# Patient Record
Sex: Female | Born: 1960 | Race: White | Hispanic: No | Marital: Single | Smoking: Never smoker
Health system: Southern US, Community
[De-identification: ages and names within clinical notes are randomized; demographics above are authoritative.]

## PROBLEM LIST (undated history)

## (undated) DIAGNOSIS — I10 Essential (primary) hypertension: Secondary | ICD-10-CM

## (undated) DIAGNOSIS — E119 Type 2 diabetes mellitus without complications: Secondary | ICD-10-CM

## (undated) DIAGNOSIS — G629 Polyneuropathy, unspecified: Secondary | ICD-10-CM

## (undated) DIAGNOSIS — M5430 Sciatica, unspecified side: Secondary | ICD-10-CM

## (undated) HISTORY — PX: BACK SURGERY: SHX140

---

## 2018-11-27 ENCOUNTER — Emergency Department (HOSPITAL_BASED_OUTPATIENT_CLINIC_OR_DEPARTMENT_OTHER): Payer: Commercial Managed Care - PPO

## 2018-11-27 ENCOUNTER — Observation Stay (HOSPITAL_COMMUNITY): Payer: Commercial Managed Care - PPO

## 2018-11-27 ENCOUNTER — Encounter (HOSPITAL_BASED_OUTPATIENT_CLINIC_OR_DEPARTMENT_OTHER): Payer: Self-pay | Admitting: Adult Health

## 2018-11-27 ENCOUNTER — Other Ambulatory Visit: Payer: Self-pay

## 2018-11-27 ENCOUNTER — Inpatient Hospital Stay (HOSPITAL_BASED_OUTPATIENT_CLINIC_OR_DEPARTMENT_OTHER)
Admission: EM | Admit: 2018-11-27 | Discharge: 2018-11-29 | DRG: 065 | Disposition: A | Discharge status: Home / Self Care | Payer: Commercial Managed Care - PPO | Attending: Family Medicine | Admitting: Family Medicine

## 2018-11-27 DIAGNOSIS — Z6838 Body mass index (BMI) 38.0-38.9, adult: Secondary | ICD-10-CM | POA: Diagnosis not present

## 2018-11-27 DIAGNOSIS — R29810 Facial weakness: Secondary | ICD-10-CM | POA: Diagnosis present

## 2018-11-27 DIAGNOSIS — E1151 Type 2 diabetes mellitus with diabetic peripheral angiopathy without gangrene: Secondary | ICD-10-CM | POA: Diagnosis present

## 2018-11-27 DIAGNOSIS — Z20828 Contact with and (suspected) exposure to other viral communicable diseases: Secondary | ICD-10-CM | POA: Diagnosis present

## 2018-11-27 DIAGNOSIS — E1169 Type 2 diabetes mellitus with other specified complication: Secondary | ICD-10-CM | POA: Diagnosis present

## 2018-11-27 DIAGNOSIS — I152 Hypertension secondary to endocrine disorders: Secondary | ICD-10-CM | POA: Diagnosis not present

## 2018-11-27 DIAGNOSIS — E1165 Type 2 diabetes mellitus with hyperglycemia: Secondary | ICD-10-CM | POA: Diagnosis not present

## 2018-11-27 DIAGNOSIS — Z79899 Other long term (current) drug therapy: Secondary | ICD-10-CM | POA: Diagnosis not present

## 2018-11-27 DIAGNOSIS — I6381 Other cerebral infarction due to occlusion or stenosis of small artery: Principal | ICD-10-CM | POA: Diagnosis present

## 2018-11-27 DIAGNOSIS — R29704 NIHSS score 4: Secondary | ICD-10-CM | POA: Diagnosis not present

## 2018-11-27 DIAGNOSIS — Z833 Family history of diabetes mellitus: Secondary | ICD-10-CM

## 2018-11-27 DIAGNOSIS — E785 Hyperlipidemia, unspecified: Secondary | ICD-10-CM | POA: Diagnosis present

## 2018-11-27 DIAGNOSIS — Z8249 Family history of ischemic heart disease and other diseases of the circulatory system: Secondary | ICD-10-CM | POA: Diagnosis not present

## 2018-11-27 DIAGNOSIS — I6522 Occlusion and stenosis of left carotid artery: Secondary | ICD-10-CM | POA: Diagnosis not present

## 2018-11-27 DIAGNOSIS — E1159 Type 2 diabetes mellitus with other circulatory complications: Secondary | ICD-10-CM | POA: Diagnosis present

## 2018-11-27 DIAGNOSIS — E669 Obesity, unspecified: Secondary | ICD-10-CM | POA: Diagnosis not present

## 2018-11-27 DIAGNOSIS — I672 Cerebral atherosclerosis: Secondary | ICD-10-CM | POA: Diagnosis present

## 2018-11-27 DIAGNOSIS — E119 Type 2 diabetes mellitus without complications: Secondary | ICD-10-CM

## 2018-11-27 DIAGNOSIS — I6322 Cerebral infarction due to unspecified occlusion or stenosis of basilar arteries: Secondary | ICD-10-CM | POA: Diagnosis present

## 2018-11-27 DIAGNOSIS — I63211 Cerebral infarction due to unspecified occlusion or stenosis of right vertebral arteries: Secondary | ICD-10-CM | POA: Diagnosis present

## 2018-11-27 DIAGNOSIS — I69354 Hemiplegia and hemiparesis following cerebral infarction affecting left non-dominant side: Secondary | ICD-10-CM

## 2018-11-27 DIAGNOSIS — Z794 Long term (current) use of insulin: Secondary | ICD-10-CM

## 2018-11-27 DIAGNOSIS — R531 Weakness: Secondary | ICD-10-CM

## 2018-11-27 DIAGNOSIS — I639 Cerebral infarction, unspecified: Secondary | ICD-10-CM | POA: Diagnosis present

## 2018-11-27 DIAGNOSIS — I10 Essential (primary) hypertension: Secondary | ICD-10-CM

## 2018-11-27 HISTORY — DX: Type 2 diabetes mellitus without complications: E11.9

## 2018-11-27 HISTORY — DX: Essential (primary) hypertension: I10

## 2018-11-27 LAB — CBC
HCT: 41.8 % (ref 36.0–46.0)
Hemoglobin: 13.3 g/dL (ref 12.0–15.0)
MCH: 27.1 pg (ref 26.0–34.0)
MCHC: 31.8 g/dL (ref 30.0–36.0)
MCV: 85.3 fL (ref 80.0–100.0)
Platelets: 231 10*3/uL (ref 150–400)
RBC: 4.9 MIL/uL (ref 3.87–5.11)
RDW: 14.2 % (ref 11.5–15.5)
WBC: 10 10*3/uL (ref 4.0–10.5)
nRBC: 0 % (ref 0.0–0.2)

## 2018-11-27 LAB — COMPREHENSIVE METABOLIC PANEL
ALT: 18 U/L (ref 0–44)
AST: 19 U/L (ref 15–41)
Albumin: 3.3 g/dL — ABNORMAL LOW (ref 3.5–5.0)
Alkaline Phosphatase: 78 U/L (ref 38–126)
Anion gap: 8 (ref 5–15)
BUN: 21 mg/dL — ABNORMAL HIGH (ref 6–20)
CO2: 22 mmol/L (ref 22–32)
Calcium: 8.7 mg/dL — ABNORMAL LOW (ref 8.9–10.3)
Chloride: 107 mmol/L (ref 98–111)
Creatinine, Ser: 1.1 mg/dL — ABNORMAL HIGH (ref 0.44–1.00)
GFR calc Af Amer: >60 mL/min (ref >60)
GFR calc non Af Amer: 56 mL/min — ABNORMAL LOW (ref >60)
Glucose, Bld: 181 mg/dL — ABNORMAL HIGH (ref 70–99)
Potassium: 3.8 mmol/L (ref 3.5–5.1)
Sodium: 137 mmol/L (ref 135–145)
Total Bilirubin: 0.3 mg/dL (ref 0.3–1.2)
Total Protein: 6.4 g/dL — ABNORMAL LOW (ref 6.5–8.1)

## 2018-11-27 LAB — DIFFERENTIAL
Abs Immature Granulocytes: 0.03 10*3/uL (ref 0.00–0.07)
Basophils Absolute: 0.1 10*3/uL (ref 0.0–0.1)
Basophils Relative: 1 %
Eosinophils Absolute: 0.3 10*3/uL (ref 0.0–0.5)
Eosinophils Relative: 3 %
Immature Granulocytes: 0 %
Lymphocytes Relative: 21 %
Lymphs Abs: 2.1 10*3/uL (ref 0.7–4.0)
Monocytes Absolute: 0.9 10*3/uL (ref 0.1–1.0)
Monocytes Relative: 9 %
Neutro Abs: 6.6 10*3/uL (ref 1.7–7.7)
Neutrophils Relative %: 66 %

## 2018-11-27 LAB — RAPID URINE DRUG SCREEN, HOSP PERFORMED
Amphetamines: NOT DETECTED
Barbiturates: NOT DETECTED
Benzodiazepines: NOT DETECTED
Cocaine: NOT DETECTED
Opiates: NOT DETECTED
Tetrahydrocannabinol: NOT DETECTED

## 2018-11-27 LAB — URINALYSIS, ROUTINE W REFLEX MICROSCOPIC
Bilirubin Urine: NEGATIVE
Glucose, UA: 250 mg/dL — AB
Ketones, ur: 15 mg/dL — AB
Nitrite: NEGATIVE
Protein, ur: 100 mg/dL — AB
Specific Gravity, Urine: >1.03 — ABNORMAL HIGH (ref 1.005–1.030)
pH: 5.5 (ref 5.0–8.0)

## 2018-11-27 LAB — ETHANOL: Alcohol, Ethyl (B): <10 mg/dL (ref <10)

## 2018-11-27 LAB — URINALYSIS, MICROSCOPIC (REFLEX)

## 2018-11-27 LAB — CBG MONITORING, ED: Glucose-Capillary: 215 mg/dL — ABNORMAL HIGH (ref 70–99)

## 2018-11-27 LAB — APTT: aPTT: 26 seconds (ref 24–36)

## 2018-11-27 LAB — PREGNANCY, URINE: Preg Test, Ur: NEGATIVE

## 2018-11-27 LAB — PROTIME-INR
INR: 1 (ref 0.8–1.2)
Prothrombin Time: 13.1 seconds (ref 11.4–15.2)

## 2018-11-27 LAB — GLUCOSE, CAPILLARY: Glucose-Capillary: 198 mg/dL — ABNORMAL HIGH (ref 70–99)

## 2018-11-27 LAB — SARS CORONAVIRUS 2 BY RT PCR (HOSPITAL ORDER, PERFORMED IN ~~LOC~~ HOSPITAL LAB): SARS Coronavirus 2: NEGATIVE

## 2018-11-27 MED ORDER — ACETAMINOPHEN 325 MG PO TABS
650.0000 mg | ORAL_TABLET | ORAL | Status: DC | PRN
  Administered 2018-11-27: 650 mg via ORAL
  Filled 2018-11-27: qty 2

## 2018-11-27 MED ORDER — ASPIRIN 300 MG RE SUPP: 300.0000 mg | Freq: Every day | RECTAL | Status: DC

## 2018-11-27 MED ORDER — ACETAMINOPHEN 650 MG RE SUPP: 650.0000 mg | RECTAL | Status: DC | PRN

## 2018-11-27 MED ORDER — ATORVASTATIN CALCIUM 40 MG PO TABS
40.0000 mg | ORAL_TABLET | Freq: Every day | ORAL | Status: DC
  Filled 2018-11-27: qty 1

## 2018-11-27 MED ORDER — SODIUM CHLORIDE 0.9 % IV BOLUS
1000.0000 mL | Freq: Once | INTRAVENOUS | Status: AC
  Administered 2018-11-27: 1000 mL via INTRAVENOUS

## 2018-11-27 MED ORDER — IOHEXOL 350 MG/ML SOLN
75.0000 mL | Freq: Once | INTRAVENOUS | Status: AC | PRN
  Administered 2018-11-27: 75 mL via INTRAVENOUS

## 2018-11-27 MED ORDER — STROKE: EARLY STAGES OF RECOVERY BOOK
Freq: Once | Status: AC
  Administered 2018-11-27
  Filled 2018-11-27: qty 1

## 2018-11-27 MED ORDER — ENOXAPARIN SODIUM 40 MG/0.4ML ~~LOC~~ SOLN
40.0000 mg | SUBCUTANEOUS | Status: DC
  Administered 2018-11-27: 40 mg via SUBCUTANEOUS
  Filled 2018-11-27 (×3): qty 0.4

## 2018-11-27 MED ORDER — SENNOSIDES-DOCUSATE SODIUM 8.6-50 MG PO TABS: 1.0000 | ORAL_TABLET | Freq: Every evening | ORAL | Status: DC | PRN

## 2018-11-27 MED ORDER — INSULIN ASPART 100 UNIT/ML ~~LOC~~ SOLN
0.0000 [IU] | Freq: Three times a day (TID) | SUBCUTANEOUS | Status: DC
  Administered 2018-11-28: 3 [IU] via SUBCUTANEOUS
  Administered 2018-11-28: 9 [IU] via SUBCUTANEOUS
  Administered 2018-11-28 – 2018-11-29 (×3): 5 [IU] via SUBCUTANEOUS

## 2018-11-27 MED ORDER — ASPIRIN 325 MG PO TABS
325.0000 mg | ORAL_TABLET | Freq: Every day | ORAL | Status: DC
  Administered 2018-11-27: 325 mg via ORAL
  Filled 2018-11-27: qty 1

## 2018-11-27 MED ORDER — ACETAMINOPHEN 160 MG/5ML PO SOLN: 650.0000 mg | ORAL | Status: DC | PRN

## 2018-11-27 NOTE — ED Notes (Signed)
Per charge pt placed in hallway 19 bed

## 2018-11-27 NOTE — ED Notes (Signed)
Report called to Will, RN.

## 2018-11-27 NOTE — H&P (Signed)
History and Physical    Tiffany Schmitt ZOX:[id number] DOB: Oct 09, 1960 DOA: 11/27/2018  PCP: Patient, No Pcp Per  Patient coming from: [facility] ED  I have personally briefly reviewed patient's old medical records in [facility]  Chief Complaint: Left-sided weakness  HPI: Tiffany Schmitt is a 58 y.o. female with medical history significant for type 2 diabetes and hypertension who presented to [facility] ED for evaluation of left-sided weakness.  Patient states she was in her usual state of health until 11/23/2018.  She reports lifting heavy objects earlier in the day and then later in the evening she developed new onset left-sided weakness affecting her arm and leg.  She reports experiencing left-sided headache afterwards with some nausea, lightheadedness, dizziness without syncope.  She reports occasional palpitations without chest pain or dyspnea.  She denies any dysuria.  She has been having difficulty with ambulation.  She reports chronic decreased sensation in her feet attributed to her diabetes.  ED Course:  Initial vitals showed BP 152/79, pulse 74, RR 18, temp 98.9 Fahrenheit, SPO2 99% on room air.  Labs notable for WBC 10.0, hemoglobin 13.3, platelets 231,000, sodium 137, potassium 3.8, bicarb 22, BUN 21, creatinine 1.1, serum glucose 181.  UDS and serum ethanol levels were undetectable.  Urinalysis showed negative nitrites, small leukocytes, 0-5 RBCs, 6-10 WBCs, many bacteria on microscopy.  Urine pregnancy test was negative.  SARS-CoV-2 test was negative.  CT head without contrast was negative for acute infarction or intracranial hemorrhage.  CTA head/neck showed intracranial atherosclerotic disease with multifocal stenoses with moderate stenosis within the paraclinoid right ICA, moderate stenosis within cavernous left ICA, moderate stenosis within the intracranial right vertebral artery, focal moderate stenosis within the mid to distal basilar artery, and focal  moderate/severe stenosis within the P2 left posterior cerebral artery.  The common and internal carotid arteries are patent within the neck bilaterally, radiology report suspects greater than 50% stenosis at the origin of the left ICA.  Bilateral vertebral arteries were patent within the neck.  Neurology were consulted and recommended ED to ED transfer for stat MRI brain and medical admission.  Review of Systems: All systems reviewed and are negative except as documented in history of present illness above.   Past Medical History:  Diagnosis Date   Diabetes mellitus without complication (HCC)    Hypertension     History reviewed. No pertinent surgical history.  Social History:  reports that she has never smoked. She has never used smokeless tobacco. She reports that she does not drink alcohol or use drugs.  No Known Allergies  Family History  Problem Relation Age of Onset   Hypertension Mother    Diabetes Mother    Hypertension Father    Diabetes Father      Prior to Admission medications   Medication Sig Start Date End Date Taking? Authorizing Provider  insulin lispro (HUMALOG) 100 UNIT/ML injection Use 76 units with V-Go insulin patch daily, up to 100 units 03/20/18  Yes [provider]  lisinopril-hydrochlorothiazide (ZESTORETIC) 10-12.5 MG tablet TAKE 1 TABLET BY MOUTH ONCE DAILY 04/25/16  Yes [provider]  metFORMIN (GLUCOPHAGE) 1000 MG tablet Take 1,000 mg by mouth 2 (two) times daily. 10/28/18   [provider]  TRULICITY 0.75 MG/0.5ML SOPN INJECT 0.75MG  INTO THE SKIN ONCE A WEEK 11/09/18   [provider]    Physical Exam: Vitals:   11/27/18 1730 11/27/18 1825 11/27/18 1936 11/27/18 1938  BP: (!) 151/68 (!) 145/65  (!) 176/76  Pulse: 66 77  71  Resp: 19 18  16   Temp:    98.4 F (36.9 C)  TempSrc:    Oral  SpO2: 96% 98% 98% 96%  Weight:      Height:        Constitutional: Obese woman resting in the left lateral  decubitus position, NAD, calm, comfortable Eyes: PERRL, lids and conjunctivae normal ENMT: Mucous membranes are moist. Posterior pharynx clear of any exudate or lesions.Normal dentition.  Neck: normal, supple, no masses. Respiratory: clear to auscultation bilaterally, no wheezing, no crackles. Normal respiratory effort. No accessory muscle use.  Cardiovascular: Regular rate and rhythm, no murmurs / rubs / gallops. No extremity edema. Abdomen: no tenderness, no masses palpated. No hepatosplenomegaly. Bowel sounds positive.  Musculoskeletal: no clubbing / cyanosis. No joint deformity upper and lower extremities. Good ROM, no contractures. Normal muscle tone.  Skin: no rashes, lesions, ulcers. No induration Neurologic: CN 2-12 grossly intact except for slight right facial drooping. Sensation intact, DTR normal. Strength 5/5 in all extremities except 4/5 LUE.  Psychiatric: Normal judgment and insight. Alert and oriented x 3. Normal mood.     Labs on Admission: I have personally reviewed following labs and imaging studies  CBC: Recent Labs  Lab 11/27/18 1323  WBC 10.0  NEUTROABS 6.6  HGB 13.3  HCT 41.8  MCV 85.3  PLT 231   Basic Metabolic Panel: Recent Labs  Lab 11/27/18 1353  NA 137  K 3.8  CL 107  CO2 22  GLUCOSE 181*  BUN 21*  CREATININE 1.10*  CALCIUM 8.7*   GFR: Estimated Creatinine Clearance: 63.6 mL/min (A) (by C-G formula based on SCr of 1.1 mg/dL (H)). Liver Function Tests: Recent Labs  Lab 11/27/18 1353  AST 19  ALT 18  ALKPHOS 78  BILITOT 0.3  PROT 6.4*  ALBUMIN 3.3*   No results for input(s): LIPASE, AMYLASE in the last 168 hours. No results for input(s): AMMONIA in the last 168 hours. Coagulation Profile: Recent Labs  Lab 11/27/18 1353  INR 1.0   Cardiac Enzymes: No results for input(s): CKTOTAL, CKMB, CKMBINDEX, TROPONINI in the last 168 hours. BNP (last 3 results) No results for input(s): PROBNP in the last 8760 hours. HbA1C: No results for  input(s): HGBA1C in the last 72 hours. CBG: Recent Labs  Lab 11/27/18 1313  GLUCAP 215*   Lipid Profile: No results for input(s): CHOL, HDL, LDLCALC, TRIG, CHOLHDL, LDLDIRECT in the last 72 hours. Thyroid Function Tests: No results for input(s): TSH, T4TOTAL, FREET4, T3FREE, THYROIDAB in the last 72 hours. Anemia Panel: No results for input(s): VITAMINB12, FOLATE, FERRITIN, TIBC, IRON, RETICCTPCT in the last 72 hours. Urine analysis:    Component Value Date/Time   COLORURINE YELLOW 11/27/2018 1640   APPEARANCEUR CLOUDY (A) 11/27/2018 1640   LABSPEC >1.030 (H) 11/27/2018 1640   PHURINE 5.5 11/27/2018 1640   GLUCOSEU 250 (A) 11/27/2018 1640   HGBUR TRACE (A) 11/27/2018 1640   BILIRUBINUR NEGATIVE 11/27/2018 1640   KETONESUR 15 (A) 11/27/2018 1640   PROTEINUR 100 (A) 11/27/2018 1640   NITRITE NEGATIVE 11/27/2018 1640   LEUKOCYTESUR SMALL (A) 11/27/2018 1640    Radiological Exams on Admission: Ct Angio Head W Or Wo Contrast  Result Date: 11/27/2018 CLINICAL DATA:  Focal neuro deficit, greater than 6 hours, stroke suspected. Additional history provided: Patient presents with left-sided weakness that began Saturday unknown time. Reports headache on Friday and not feeling well throughout the weekend, feeling off balance and heaviness of left side. EXAM:  CT ANGIOGRAPHY HEAD AND NECK TECHNIQUE: Multidetector CT imaging of the head and neck was performed using the standard protocol during bolus administration of intravenous contrast. Multiplanar CT image reconstructions and MIPs were obtained to evaluate the vascular anatomy. Carotid stenosis measurements (when applicable) are obtained utilizing NASCET criteria, using the distal internal carotid diameter as the denominator. CONTRAST:  75mL OMNIPAQUE IOHEXOL 350 MG/ML SOLN COMPARISON:  Noncontrast head CT performed earlier the same day 11/27/2018. FINDINGS: CT HEAD FINDINGS No evidence of acute intracranial hemorrhage. No demarcated cortical  infarction. No evidence of intracranial mass. No midline shift or extra-axial fluid collection. Cerebral volume is normal for age. Vascular: Reported separately Skull: Normal. Negative for fracture or focal lesion. Sinuses: No significant paranasal sinus disease or mastoid effusion. Orbits: Visualized orbits demonstrate no acute abnormality. Review of the MIP images confirms the above findings CTA NECK FINDINGS Aortic arch: Common origin of the innominate and left common carotid arteries. Included portions of the aortic arch demonstrate no evidence of dissection or aneurysm. Atherosclerotic plaque within the visualized aortic arch and proximal major branch vessels of the neck. Right carotid system: Common and internal carotid arteries patent within the neck without stenosis. Minimal soft and calcified plaque within the proximal ICA. Left carotid system: Common carotid artery patent without stenosis. There is significant plaque within the carotid bifurcation and proximal ICA. It is difficult to accurately measure the degree of stenosis at the origin of the left ICA due to irregularity of soft and calcified plaque at this site. There may be greater than 50% stenosis at the origin of the left ICA. Distal to this the ICA is patent within the neck without significant stenosis. Vertebral arteries: Right vertebral artery dominant. The vertebral arteries are patent within the neck bilaterally without significant stenosis. Skeleton: No acute bony abnormality. Cervical spondylosis greatest at C5-C6 and C6-C7 where there is moderate disc degeneration small posterior disc osteophytes. Other neck: No soft tissue neck mass or pathologically enlarged cervical chain lymph nodes. Thyroid negative. Upper chest: Patchy ground-glass opacity/mosaic attenuation within the imaged lung apices may reflect mild edema or sequela of small airways disease. Review of the MIP images confirms the above findings CTA HEAD FINDINGS Anterior  circulation: Soft and calcified plaque within the cavernous and paraclinoid right internal carotid artery. Sites of mild to moderate stenosis within the cavernous right ICA. Moderate focal stenosis within the paraclinoid right ICA (series 11, image 247). Soft and calcified plaque within the cavernous and paraclinoid left internal carotid artery. Sites of at least moderate stenosis within the cavernous left ICA. The right middle and anterior cerebral arteries are patent without significant proximal stenosis. The left middle and anterior cerebral arteries are patent without significant proximal stenosis. No intracranial aneurysm is identified. Posterior circulation: Significant soft and calcified plaque within the intracranial right vertebral artery with sites of up to moderate stenosis. The left vertebral artery is patent without significant stenosis. Atherosclerotic irregularity of the basilar artery with noncalcified plaque. Focal moderate stenosis within the mid to distal basilar artery. Significant atherosclerotic irregularity of the bilateral posterior cerebral arteries. Mild-to-moderate focal stenosis within the right PCA at the P1-P2 junction. Moderate/severe focal stenosis within the P2 left posterior cerebral artery. Venous sinuses: Within limitations of contrast timing, no convincing thrombus. Anatomic variants: Posterior cerebral arteries are poorly delineated bilaterally and may be hypoplastic or absent. Review of the MIP images confirms the above findings IMPRESSION: CT head: No CT evidence of acute intracranial abnormality. Consider brain MRI for further evaluation, as clinically  warranted. CTA head: 1. Intracranial atherosclerotic disease with multifocal stenoses, most notably as follows. 2. Moderate stenosis within the paraclinoid right internal carotid artery. 3. Sites of at least moderate stenosis within the cavernous left internal carotid artery. 4. Sites of up to moderate stenosis within the  intracranial right vertebral artery. 5. Focal moderate stenosis within the mid to distal basilar artery. 6. Focal moderate/severe stenosis within the P2 left posterior cerebral artery. CTA neck: 1. Common and internal carotid arteries patent within the neck bilaterally. It is difficult to accurately quantify the degree of stenosis at the origin of the left ICA due to irregularity of prominent soft and calcified plaque at this site. Greater than 50% stenosis may be present. Carotid artery duplex recommended for further evaluation. 2. Bilateral vertebral arteries patent within the neck without stenosis. 3. Patchy ground-glass opacity/mosaic attenuation within the imaged lung apices, which may reflect edema or sequela of small airways disease. Electronically Signed   By: Kellie Simmering   On: 11/27/2018 18:56   Ct Head Wo Contrast  Result Date: 11/27/2018 CLINICAL DATA:  Left-sided weakness that began Saturday EXAM: CT HEAD WITHOUT CONTRAST TECHNIQUE: Contiguous axial images were obtained from the base of the skull through the vertex without intravenous contrast. COMPARISON:  None. FINDINGS: Brain: No evidence of acute infarction, hemorrhage, hydrocephalus, extra-axial collection or mass lesion/mass effect. Vascular: There are vascular calcifications in the carotid siphons. Skull: Normal. Negative for fracture or focal lesion. Sinuses/Orbits: No acute finding. Other: None. IMPRESSION: No acute intracranial process. Electronically Signed   By: Zerita Boers M.D.   On: 11/27/2018 14:04   Ct Angio Neck W And/or Wo Contrast  Result Date: 11/27/2018 CLINICAL DATA:  Focal neuro deficit, greater than 6 hours, stroke suspected. Additional history provided: Patient presents with left-sided weakness that began Saturday unknown time. Reports headache on Friday and not feeling well throughout the weekend, feeling off balance and heaviness of left side. EXAM: CT ANGIOGRAPHY HEAD AND NECK TECHNIQUE: Multidetector CT imaging  of the head and neck was performed using the standard protocol during bolus administration of intravenous contrast. Multiplanar CT image reconstructions and MIPs were obtained to evaluate the vascular anatomy. Carotid stenosis measurements (when applicable) are obtained utilizing NASCET criteria, using the distal internal carotid diameter as the denominator. CONTRAST:  63mL OMNIPAQUE IOHEXOL 350 MG/ML SOLN COMPARISON:  Noncontrast head CT performed earlier the same day 11/27/2018. FINDINGS: CT HEAD FINDINGS No evidence of acute intracranial hemorrhage. No demarcated cortical infarction. No evidence of intracranial mass. No midline shift or extra-axial fluid collection. Cerebral volume is normal for age. Vascular: Reported separately Skull: Normal. Negative for fracture or focal lesion. Sinuses: No significant paranasal sinus disease or mastoid effusion. Orbits: Visualized orbits demonstrate no acute abnormality. Review of the MIP images confirms the above findings CTA NECK FINDINGS Aortic arch: Common origin of the innominate and left common carotid arteries. Included portions of the aortic arch demonstrate no evidence of dissection or aneurysm. Atherosclerotic plaque within the visualized aortic arch and proximal major branch vessels of the neck. Right carotid system: Common and internal carotid arteries patent within the neck without stenosis. Minimal soft and calcified plaque within the proximal ICA. Left carotid system: Common carotid artery patent without stenosis. There is significant plaque within the carotid bifurcation and proximal ICA. It is difficult to accurately measure the degree of stenosis at the origin of the left ICA due to irregularity of soft and calcified plaque at this site. There may be greater than 50% stenosis at  the origin of the left ICA. Distal to this the ICA is patent within the neck without significant stenosis. Vertebral arteries: Right vertebral artery dominant. The vertebral  arteries are patent within the neck bilaterally without significant stenosis. Skeleton: No acute bony abnormality. Cervical spondylosis greatest at C5-C6 and C6-C7 where there is moderate disc degeneration small posterior disc osteophytes. Other neck: No soft tissue neck mass or pathologically enlarged cervical chain lymph nodes. Thyroid negative. Upper chest: Patchy ground-glass opacity/mosaic attenuation within the imaged lung apices may reflect mild edema or sequela of small airways disease. Review of the MIP images confirms the above findings CTA HEAD FINDINGS Anterior circulation: Soft and calcified plaque within the cavernous and paraclinoid right internal carotid artery. Sites of mild to moderate stenosis within the cavernous right ICA. Moderate focal stenosis within the paraclinoid right ICA (series 11, image 247). Soft and calcified plaque within the cavernous and paraclinoid left internal carotid artery. Sites of at least moderate stenosis within the cavernous left ICA. The right middle and anterior cerebral arteries are patent without significant proximal stenosis. The left middle and anterior cerebral arteries are patent without significant proximal stenosis. No intracranial aneurysm is identified. Posterior circulation: Significant soft and calcified plaque within the intracranial right vertebral artery with sites of up to moderate stenosis. The left vertebral artery is patent without significant stenosis. Atherosclerotic irregularity of the basilar artery with noncalcified plaque. Focal moderate stenosis within the mid to distal basilar artery. Significant atherosclerotic irregularity of the bilateral posterior cerebral arteries. Mild-to-moderate focal stenosis within the right PCA at the P1-P2 junction. Moderate/severe focal stenosis within the P2 left posterior cerebral artery. Venous sinuses: Within limitations of contrast timing, no convincing thrombus. Anatomic variants: Posterior cerebral  arteries are poorly delineated bilaterally and may be hypoplastic or absent. Review of the MIP images confirms the above findings IMPRESSION: CT head: No CT evidence of acute intracranial abnormality. Consider brain MRI for further evaluation, as clinically warranted. CTA head: 1. Intracranial atherosclerotic disease with multifocal stenoses, most notably as follows. 2. Moderate stenosis within the paraclinoid right internal carotid artery. 3. Sites of at least moderate stenosis within the cavernous left internal carotid artery. 4. Sites of up to moderate stenosis within the intracranial right vertebral artery. 5. Focal moderate stenosis within the mid to distal basilar artery. 6. Focal moderate/severe stenosis within the P2 left posterior cerebral artery. CTA neck: 1. Common and internal carotid arteries patent within the neck bilaterally. It is difficult to accurately quantify the degree of stenosis at the origin of the left ICA due to irregularity of prominent soft and calcified plaque at this site. Greater than 50% stenosis may be present. Carotid artery duplex recommended for further evaluation. 2. Bilateral vertebral arteries patent within the neck without stenosis. 3. Patchy ground-glass opacity/mosaic attenuation within the imaged lung apices, which may reflect edema or sequela of small airways disease. Electronically Signed   By: Jackey LogeKyle  Golden   On: 11/27/2018 18:56    EKG: Independently reviewed. Sinus rhythm with late R wave transition, no prior for comparison.  Assessment/Plan Principal Problem:   Left-sided weakness Active Problems:   Diabetes mellitus without complication (HCC)   Hypertension associated with diabetes (HCC)  Tiffany Schmitt is a 58 y.o. female with medical history significant for type 2 diabetes and hypertension who is admitted for stroke work-up in setting of new left-sided weakness.   Left-sided weakness: Admitted for stroke work-up.  CTA head/neck with multifocal  areas of moderate stenosis including moderate/severe stenosis within the P2  left posterior cerebral artery. -Neurology following -MRI brain pending -Check carotid Dopplers -Echocardiogram -Start aspirin 325 mg -Start atorvastatin 40 mg daily -Monitor on telemetry -Check lipid panel, A1c -PT/OT/SLP eval  Type 2 diabetes: Home regimen includes metformin and Trulicity. -Continue sensitive SSI while in hospital  Hypertension: On lisinopril-HCTZ as an outpatient.  Will hold to allow for permissive hypertension for now.   DVT prophylaxis: Lovenox Code Status: Full code, confirmed with patient Family Communication: Discussed with son at bedside Disposition Plan: Pending clinical progress Consults called: Neurology Admission status: Observation   Darreld Mclean MD [facility]  If 7PM-7AM, please contact night-coverage [email or web address]  11/27/2018, 8:13 PM

## 2018-11-27 NOTE — ED Notes (Signed)
Patient transported to MRI 

## 2018-11-27 NOTE — ED Triage Notes (Signed)
Pt arrived via [facility] from [facility] Pt c/o left sided weakness and altered speech starting Saturday. Also reports headache prior to weakness.

## 2018-11-27 NOTE — ED Notes (Signed)
Told pt could go to [facility] ED and they would accept by CN.

## 2018-11-27 NOTE — ED Notes (Signed)
Neurologist at bedside. 

## 2018-11-27 NOTE — ED Provider Notes (Signed)
[facility] EMERGENCY DEPARTMENT Provider Note   CSN: [id number] Arrival date & time: 11/27/18  1300     History   Chief Complaint Chief Complaint  Patient presents with  . Cerebrovascular Accident    HPI Tiffany Schmitt is a 58 y.o. female.     The history is provided by the patient.  Cerebrovascular Accident This is a new problem. Episode onset: 4 days ago. The problem occurs constantly. The problem has been gradually worsening. Associated symptoms include headaches. Pertinent negatives include no chest pain, no abdominal pain and no shortness of breath. Associated symptoms comments: No mid neck pain, vision changes, fever, vomiting or abdominal pain.  Patient states her speech at times seemed slow and not quite normal.  Patient states when she started to not feel normal on Saturday she called work and they told her she should be tested for coronavirus.  Her test came back negative today but because she was not feeling well she decided to come to the hospital.  She does have a history of diabetes and hypertension.  Sugars been running in the 200s at home.  She denies any recent medication changes and does not take anticoagulation.  She is also having trouble walking. Nothing aggravates the symptoms. Nothing relieves the symptoms. She has tried nothing for the symptoms. The treatment provided no relief.    No past medical history on file.  There are no active problems to display for this patient.      OB History   No obstetric history on file.      Home Medications    Prior to Admission medications   Medication Sig Start Date End Date Taking? Authorizing Provider  lisinopril-hydrochlorothiazide (ZESTORETIC) 10-12.5 MG tablet Take 1 tablet by mouth daily.  04/25/16  Yes [provider]  metFORMIN (GLUCOPHAGE) 1000 MG tablet Take 1,000 mg by mouth 2 (two) times daily. 10/28/18  Yes [provider]  omeprazole (PRILOSEC) 20 MG capsule Take 20 mg  by mouth daily.   Yes [provider]  TRULICITY 0.75 MG/0.5ML SOPN Inject 0.75 mg into the skin every Saturday.  11/09/18  Yes [provider]  aspirin EC 81 MG EC tablet Take 1 tablet (81 mg total) by mouth daily. 11/30/18 05/29/19  Hughie Closs, MD  atorvastatin (LIPITOR) 40 MG tablet Take 1 tablet (40 mg total) by mouth daily at 6 PM. 11/29/18 12/29/18  Hughie Closs, MD  clopidogrel (PLAVIX) 75 MG tablet Take 1 tablet (75 mg total) by mouth daily for 20 days. 11/30/18 12/20/18  Hughie Closs, MD  Insulin Glargine (LANTUS) 100 UNIT/ML Solostar Pen Inject 15 Units into the skin at bedtime. 11/29/18   Hughie Closs, MD  insulin lispro (HUMALOG) 100 UNIT/ML injection Inject 0.05-0.1 mLs (5-10 Units total) into the skin 2 (two) times daily. Sliding Scale 11/29/18   Hughie Closs, MD  Insulin Pen Needle 32G X 4 MM MISC n 11/29/18   Hughie Closs, MD    Family History No family history on file.  Social History Social History   Tobacco Use  . Smoking status: Not on file  Substance Use Topics  . Alcohol use: Not on file  . Drug use: Not on file     Allergies   Patient has no allergy information on record.   Review of Systems Review of Systems  Respiratory: Negative for shortness of breath.   Cardiovascular: Negative for chest pain.  Gastrointestinal: Negative for abdominal pain.  Neurological: Positive for headaches.  All other systems  reviewed and are negative.    Physical Exam Updated Vital Signs BP (!) 152/79   Pulse 74   Temp 98.9 F (37.2 C) (Oral)   Resp 18   SpO2 99%   Physical Exam Vitals signs and nursing note reviewed.  Constitutional:      General: She is not in acute distress.    Appearance: Normal appearance. She is well-developed. She is obese.  HENT:     Head: Normocephalic and atraumatic.  Eyes:     General: No visual field deficit.    Pupils: Pupils are equal, round, and reactive to light.  Cardiovascular:     Rate and Rhythm:  Normal rate and regular rhythm.     Heart sounds: Normal heart sounds. No murmur. No friction rub.  Pulmonary:     Effort: Pulmonary effort is normal.     Breath sounds: Normal breath sounds. No wheezing or rales.  Abdominal:     General: Bowel sounds are normal. There is no distension.     Palpations: Abdomen is soft.     Tenderness: There is no abdominal tenderness. There is no guarding or rebound.  Musculoskeletal: Normal range of motion.        General: No tenderness.     Right lower leg: No edema.     Left lower leg: No edema.     Comments: No edema  Skin:    General: Skin is warm and dry.     Findings: No rash.  Neurological:     Mental Status: She is alert and oriented to person, place, and time.     Cranial Nerves: No cranial nerve deficit, dysarthria or facial asymmetry.     Sensory: Sensation is intact.     Motor: Weakness and pronator drift present.     Coordination: Coordination is intact.     Comments: 4/4 strength in the left upper and lower ext with mild pronator drift in both.  5/5 strength on the right without pronator drift.  Psychiatric:        Behavior: Behavior normal.      ED Treatments / Results  Labs (all labs ordered are listed, but only abnormal results are displayed) Labs Reviewed  CBG MONITORING, ED - Abnormal; Notable for the following components:      Result Value   Glucose-Capillary 215 (*)    All other components within normal limits  ETHANOL  PROTIME-INR  APTT  CBC  DIFFERENTIAL  COMPREHENSIVE METABOLIC PANEL  RAPID URINE DRUG SCREEN, HOSP PERFORMED  URINALYSIS, ROUTINE W REFLEX MICROSCOPIC  PREGNANCY, URINE    EKG None  Radiology No results found.  Procedures Procedures (including critical care time)  Medications Ordered in ED Medications - No data to display   Initial Impression / Assessment and Plan / ED Course  I have reviewed the triage vital signs and the nursing notes.  Pertinent labs & imaging results that were  available during my care of the patient were reviewed by me and considered in my medical decision making (see chart for details).        58 y/o female with multiple stroke risk factors presenting today with 4 days of persistent left sided weakness and intermittent mild HA.  On exam pt has left sided weakness and drift.  Awake and alert and not in the stroke window do to symptoms present for 4 days.  Patient was seen in June for dizziness and rule out stroke.  At that time she had a CT of her  head and a CTA that showed no significant blockage.  Stroke order set initiated at this time.  Patient is denying any neck pain and headache is only mild.  Low suspicion for vertebral dissection or occlusion.  Pt having no dizziness today.  Final Clinical Impressions(s) / ED Diagnoses   Final diagnoses:  None    ED Discharge Orders    None       Gwyneth SproutPlunkett, Elodia Haviland, MD 12/07/18 [phone]

## 2018-11-27 NOTE — ED Provider Notes (Signed)
I received the patient in signout from Dr. Maryan Rued.  Briefly the patient is a 58 year old female with persistent left-sided weakness is been going on for about 4 days.  Clinically the patient has had a stroke and the plan is for admission after lab work is returned.  Patient's lab work is returned and I discussed the case with Dr. Lorraine Lax, neurology.  He was concerned about potential very long wait if the patient was to be admitted to the [facility].  He was unsure if that was the right thing for the patient as she would not be able to be evaluated by a neurologist or have an MRI performed.  He recommended an ED to ED transfer for MRI.  Recommended doing a CT angiogram of the head and neck here.  I discussed case with Dr. Sedonia Small who accepts patient in transfer.  I discussed this with the patient and she is reluctant to go to [facility].  She wants to try another bolus of IV fluids.   Deno Etienne, DO 11/27/18 1830

## 2018-11-27 NOTE — Consult Note (Signed)
Neurology Consultation Reason for Consult: Left sided weakness Referring Physician: Madilyn Hook, E  CC: Left Sided weakness  History is obtained from:patient  HPI: Tiffany Schmitt is a 58 y.o. female with a history of diabetes and hypertension who presents with left-sided weakness that is been present since Saturday.  She states that it all started suddenly, and has been a static deficit since that time.  She has noted that she has been having difficulty walking.  She had a little bit of a headache at the day of onset, but none since that time.  She was evaluated in the emergency department at [facility] where a CT/CTA were performed with no definite acute findings, but with multifocal severe intracranial atherosclerotic disease.   LKW: Saturday tpa given?: no, outside of window    ROS: A 14 point ROS was performed and is negative except as noted in the HPI.   Past Medical History:  Diagnosis Date  . Diabetes mellitus without complication (HCC)   . Hypertension      Family history: Diabetes, heart disease   Social History:  reports that she has never smoked. She has never used smokeless tobacco. She reports that she does not drink alcohol or use drugs.   Exam: Current vital signs: BP (!) 176/76 (BP Location: Right Arm)   Pulse 71   Temp 98.4 F (36.9 C) (Oral)   Resp 16   Ht 5\' 3"  (1.6 m)   Wt 99.8 kg   SpO2 96%   BMI 38.97 kg/m  Vital signs in last 24 hours: Temp:  [98.4 F (36.9 C)-98.9 F (37.2 C)] 98.4 F (36.9 C) (10/14 1938) Pulse Rate:  [66-77] 71 (10/14 1938) Resp:  [13-19] 16 (10/14 1938) BP: (136-176)/(65-79) 176/76 (10/14 1938) SpO2:  [96 %-99 %] 96 % (10/14 1938) Weight:  [99.8 kg] 99.8 kg (10/14 1318)   Physical Exam  Constitutional: Appears well-developed and well-nourished.  Psych: Affect appropriate to situation Eyes: No scleral injection HENT: No OP obstrucion Head: Normocephalic.  Cardiovascular: Normal rate and regular rhythm.   Respiratory: Effort normal, non-labored breathing GI: Soft.  No distension. There is no tenderness.  Skin: WDI  Neuro: Mental Status: Patient is awake, alert, oriented to person, place, month, year, and situation. Patient is able to give a clear and coherent history. No signs of aphasia or neglect Cranial Nerves: II: Visual Fields are full. Pupils are equal, round, and reactive to light.   III,IV, VI: EOMI without ptosis or diploplia.  V: Facial sensation is symmetric to temperature VII: Facial movement with mild left facial weakness VIII: hearing is intact to voice X: Uvula elevates symmetrically XI: Shoulder shrug is symmetric. XII: tongue is midline without atrophy or fasciculations.  Motor: Tone is normal. Bulk is normal. 5/5 strength was present on the right, she has 4/5 weakness of the left arm and 4 my/5 strength in left leg. Sensory: Sensation is symmetric to light touch and temperature in the arms and legs. 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 She will need ot be admitted for further evaluation, secondary risk factor modification, and therapy.   Recommendations: - HgbA1c, fasting lipid panel - MRI  of the brain without contrast -  Frequent neuro checks - Echocardiogram - Prophylactic therapy-Antiplatelet med: Aspirin - dose 325mg  PO or 300mg  PR - Risk factor modification - Telemetry monitoring - PT consult, OT consult, Speech consult - Stroke team to follow    Roland Rack, MD [facility] [phone]  If 7pm- 7am, please page neurology on call as listed in [place].

## 2018-11-27 NOTE — ED Notes (Signed)
Attempted report to [facility] ED, no answer.

## 2018-11-27 NOTE — ED Provider Notes (Signed)
Patient transferred from [facility] for MRI evaluation for possible CVA.  Neurology consulted on patient's ED arrival. Hospitalist consulted for admission.   Quintella Reichert, MD 11/28/18 [phone]

## 2018-11-27 NOTE — ED Triage Notes (Signed)
Presents with LEft sided weakness that began Saturday at an unknown time. She reports a headache on Friday and not feeling well throughout the weekend. She endorses feeling off balance and heaviness of the left side. She is alert and oriented

## 2018-11-27 NOTE — ED Notes (Signed)
ED unable to take pt at this time. MD notified.

## 2018-11-27 NOTE — ED Notes (Signed)
Attempted to call report to CN, unable to reach.

## 2018-11-28 ENCOUNTER — Observation Stay (HOSPITAL_COMMUNITY): Payer: Commercial Managed Care - PPO

## 2018-11-28 DIAGNOSIS — I152 Hypertension secondary to endocrine disorders: Secondary | ICD-10-CM | POA: Diagnosis present

## 2018-11-28 DIAGNOSIS — R531 Weakness: Secondary | ICD-10-CM | POA: Diagnosis not present

## 2018-11-28 DIAGNOSIS — Z833 Family history of diabetes mellitus: Secondary | ICD-10-CM | POA: Diagnosis not present

## 2018-11-28 DIAGNOSIS — E1165 Type 2 diabetes mellitus with hyperglycemia: Secondary | ICD-10-CM | POA: Diagnosis present

## 2018-11-28 DIAGNOSIS — I6381 Other cerebral infarction due to occlusion or stenosis of small artery: Secondary | ICD-10-CM | POA: Diagnosis present

## 2018-11-28 DIAGNOSIS — I10 Essential (primary) hypertension: Secondary | ICD-10-CM | POA: Diagnosis not present

## 2018-11-28 DIAGNOSIS — E1151 Type 2 diabetes mellitus with diabetic peripheral angiopathy without gangrene: Secondary | ICD-10-CM | POA: Diagnosis present

## 2018-11-28 DIAGNOSIS — Z79899 Other long term (current) drug therapy: Secondary | ICD-10-CM | POA: Diagnosis not present

## 2018-11-28 DIAGNOSIS — I639 Cerebral infarction, unspecified: Secondary | ICD-10-CM | POA: Diagnosis present

## 2018-11-28 DIAGNOSIS — Z794 Long term (current) use of insulin: Secondary | ICD-10-CM | POA: Diagnosis not present

## 2018-11-28 DIAGNOSIS — I6522 Occlusion and stenosis of left carotid artery: Secondary | ICD-10-CM | POA: Diagnosis present

## 2018-11-28 DIAGNOSIS — Z20828 Contact with and (suspected) exposure to other viral communicable diseases: Secondary | ICD-10-CM | POA: Diagnosis present

## 2018-11-28 DIAGNOSIS — I672 Cerebral atherosclerosis: Secondary | ICD-10-CM | POA: Diagnosis present

## 2018-11-28 DIAGNOSIS — I6389 Other cerebral infarction: Secondary | ICD-10-CM

## 2018-11-28 DIAGNOSIS — Z6838 Body mass index (BMI) 38.0-38.9, adult: Secondary | ICD-10-CM | POA: Diagnosis not present

## 2018-11-28 DIAGNOSIS — R29810 Facial weakness: Secondary | ICD-10-CM | POA: Diagnosis present

## 2018-11-28 DIAGNOSIS — I69354 Hemiplegia and hemiparesis following cerebral infarction affecting left non-dominant side: Secondary | ICD-10-CM | POA: Diagnosis not present

## 2018-11-28 DIAGNOSIS — I63211 Cerebral infarction due to unspecified occlusion or stenosis of right vertebral arteries: Secondary | ICD-10-CM | POA: Diagnosis present

## 2018-11-28 DIAGNOSIS — Z8249 Family history of ischemic heart disease and other diseases of the circulatory system: Secondary | ICD-10-CM | POA: Diagnosis not present

## 2018-11-28 DIAGNOSIS — E1169 Type 2 diabetes mellitus with other specified complication: Secondary | ICD-10-CM | POA: Diagnosis present

## 2018-11-28 DIAGNOSIS — E669 Obesity, unspecified: Secondary | ICD-10-CM | POA: Diagnosis present

## 2018-11-28 DIAGNOSIS — R29704 NIHSS score 4: Secondary | ICD-10-CM | POA: Diagnosis present

## 2018-11-28 DIAGNOSIS — E785 Hyperlipidemia, unspecified: Secondary | ICD-10-CM | POA: Diagnosis present

## 2018-11-28 LAB — LIPID PANEL
Cholesterol: 272 mg/dL — ABNORMAL HIGH (ref 0–200)
HDL: 40 mg/dL — ABNORMAL LOW (ref >40)
LDL Cholesterol: 166 mg/dL — ABNORMAL HIGH (ref 0–99)
Total CHOL/HDL Ratio: 6.8 RATIO
Triglycerides: 332 mg/dL — ABNORMAL HIGH (ref <150)
VLDL: 66 mg/dL — ABNORMAL HIGH (ref 0–40)

## 2018-11-28 LAB — GLUCOSE, CAPILLARY
Glucose-Capillary: 228 mg/dL — ABNORMAL HIGH (ref 70–99)
Glucose-Capillary: 298 mg/dL — ABNORMAL HIGH (ref 70–99)
Glucose-Capillary: 368 mg/dL — ABNORMAL HIGH (ref 70–99)
Glucose-Capillary: 351 mg/dL — ABNORMAL HIGH (ref 70–99)
Glucose-Capillary: 171 mg/dL — ABNORMAL HIGH (ref 70–99)

## 2018-11-28 LAB — BASIC METABOLIC PANEL
Anion gap: 13 (ref 5–15)
BUN: 17 mg/dL (ref 6–20)
CO2: 21 mmol/L — ABNORMAL LOW (ref 22–32)
Calcium: 8.8 mg/dL — ABNORMAL LOW (ref 8.9–10.3)
Chloride: 103 mmol/L (ref 98–111)
Creatinine, Ser: 1.12 mg/dL — ABNORMAL HIGH (ref 0.44–1.00)
GFR calc Af Amer: >60 mL/min (ref >60)
GFR calc non Af Amer: 54 mL/min — ABNORMAL LOW (ref >60)
Glucose, Bld: 227 mg/dL — ABNORMAL HIGH (ref 70–99)
Potassium: 3.9 mmol/L (ref 3.5–5.1)
Sodium: 137 mmol/L (ref 135–145)

## 2018-11-28 LAB — CBC
HCT: 35.5 % — ABNORMAL LOW (ref 36.0–46.0)
Hemoglobin: 11.9 g/dL — ABNORMAL LOW (ref 12.0–15.0)
MCH: 27.7 pg (ref 26.0–34.0)
MCHC: 33.5 g/dL (ref 30.0–36.0)
MCV: 82.6 fL (ref 80.0–100.0)
Platelets: 236 10*3/uL (ref 150–400)
RBC: 4.3 MIL/uL (ref 3.87–5.11)
RDW: 14.2 % (ref 11.5–15.5)
WBC: 8.5 10*3/uL (ref 4.0–10.5)
nRBC: 0 % (ref 0.0–0.2)

## 2018-11-28 LAB — ECHOCARDIOGRAM COMPLETE
Height: 63 in
Weight: 3495.61 oz

## 2018-11-28 LAB — HEMOGLOBIN A1C
Hgb A1c MFr Bld: 11.3 % — ABNORMAL HIGH (ref 4.8–5.6)
Mean Plasma Glucose: 277.61 mg/dL

## 2018-11-28 LAB — HIV ANTIBODY (ROUTINE TESTING W REFLEX): HIV Screen 4th Generation wRfx: NONREACTIVE

## 2018-11-28 MED ORDER — ASPIRIN EC 81 MG PO TBEC
81.0000 mg | DELAYED_RELEASE_TABLET | Freq: Every day | ORAL | Status: DC
  Administered 2018-11-29: 81 mg via ORAL
  Filled 2018-11-28: qty 1

## 2018-11-28 MED ORDER — CLOPIDOGREL BISULFATE 75 MG PO TABS
75.0000 mg | ORAL_TABLET | Freq: Every day | ORAL | Status: DC
  Administered 2018-11-28 – 2018-11-29 (×2): 75 mg via ORAL
  Filled 2018-11-28 (×2): qty 1

## 2018-11-28 MED ORDER — INSULIN GLARGINE 100 UNIT/ML ~~LOC~~ SOLN
15.0000 [IU] | Freq: Every day | SUBCUTANEOUS | Status: DC
  Administered 2018-11-29: 15 [IU] via SUBCUTANEOUS
  Filled 2018-11-28 (×3): qty 0.15

## 2018-11-28 NOTE — Evaluation (Addendum)
Speech Language Pathology Evaluation Patient Details Name: Tiffany Schmitt MRN: [id number] DOB: [birth year] Today's Date: 11/28/2018 Time: 1120-1140 SLP Time Calculation (min) (ACUTE ONLY): 20 min  Problem List:  Patient Active Problem List   Diagnosis Date Noted  . Acute ischemic vertebrobasilar artery brainstem stroke, right ([place]) 11/28/2018  . Acute ischemic stroke ([place]) 11/28/2018  . Left-sided weakness 11/27/2018  . Diabetes mellitus without complication ([place])   . Hypertension associated with diabetes [facility])    Past Medical History:  Past Medical History:  Diagnosis Date  . Diabetes mellitus without complication ([place])   . Hypertension    Past Surgical History: History reviewed. No pertinent surgical history. HPI:  Tiffany Schmitt is a 58 y.o. female with medical history significant for type 2 diabetes and hypertension who presented to [place] ED for evaluation of left-sided weakness.  Patient states she was in her usual state of health until 11/23/2018.  She reports lifting heavy objects earlier in the day and then later in the evening she developed new onset left-sided weakness affecting her arm and leg.  She reports experiencing left-sided headache afterwards with some nausea, lightheadedness, dizziness without syncope.  MRI 10/14 revealed: "Acute to subacute brainstem infarct in the right pons. Noassociated hemorrhage or mass effect."   Assessment / Plan / Recommendation Clinical Impression  Pt presents with cognitive function within normal limits.  Pt was assessed using the COGNISTAT (see below for additional information).  The only subtest outside of the normal range was calculations.  Pt does not think that this represents a change from her baseline.  Pt was able to participate in conversation without difficulty.  Pt's speech was clear and 100% intelligible.  Pt does not have any concerns about ability to manage medications or finances, or return to work outside of  her physical impairments.   Pt has no ST needs at this time.  SLP will sign off.  COGNISTAT - All subtests are within the average range, except where otherwise specified. Orientation: 12/12 Attention: 8/8 Comprehension: 5/6 Repetition: 12/12 Naming: 7/8 (benefited from semantic cue) Construction: Not assessed Memory: 12/12 Calculation: 2/4, Mild impairment Similarities: 5/8 Judgment: 6/6    SLP Assessment  SLP Recommendation/Assessment: Patient does not need any further Speech Lanaguage Pathology Services SLP Visit Diagnosis: Cognitive communication deficit (R41.841)    Follow Up Recommendations  None    Frequency and Duration   N/A        SLP Evaluation Cognition  Overall Cognitive Status: Within Functional Limits for tasks assessed Arousal/Alertness: Awake/alert Orientation Level: Oriented X4 Attention: Focused;Sustained Focused Attention: Appears intact Sustained Attention: Appears intact Memory: Appears intact Awareness: Appears intact Problem Solving: Appears intact Executive Function: Reasoning Reasoning: Appears intact Safety/Judgment: Appears intact       Comprehension  Auditory Comprehension Overall Auditory Comprehension: Appears within functional limits for tasks assessed Commands: Within Functional Limits Conversation: Complex    Expression Expression Primary Mode of Expression: Verbal Verbal Expression Overall Verbal Expression: Appears within functional limits for tasks assessed Initiation: No impairment Naming: No impairment Pragmatics: No impairment Written Expression Dominant Hand: Right   Oral / Motor  Motor Speech Overall Motor Speech: Appears within functional limits for tasks assessed Respiration: Within functional limits Phonation: Normal Resonance: Within functional limits Articulation: Within functional limitis Intelligibility: Intelligible Motor Planning: Witnin functional limits Motor Speech Errors: Not applicable   GO                     Nedim Oki E Gwen Sarvis, MA, CCC-SLP  Acute Rehabilitation Services Office: [phone]; Pager (10/15): [phone] 11/28/2018, 11:50 AM

## 2018-11-28 NOTE — Evaluation (Signed)
Occupational Therapy Evaluation Patient Details Name: Tiffany Schmitt MRN: [id number] DOB: 08-Dec-1960 Today's Date: 11/28/2018    History of Present Illness 58 y.o. female with medical history significant for type 2 diabetes and hypertension who presented to [place] ED for evaluation of left-sided weakness. She reports experiencing left-sided headache afterwards with some nausea, lightheadedness, dizziness without syncope.   Clinical Impression   Pt PTA: Pt independent prior with ADL, mobility, and working. Pt currently performing ADL functional mobility with RW and stability with supervisionA. Pt's main deficit is LUE weakness and coordination. Pt given HEP for [facility] and theraputty to assist with progressive HEP prior to d/c. Pt  Using LUE for grooming tasks at sink with 75% accuracy and requires increased time. Pt would benefit from continued OT skilled services for ADL and HEP for LUE. OT following acutely.     Follow Up Recommendations  Home health OT    Equipment Recommendations  None recommended by OT    Recommendations for Other Services       Precautions / Restrictions Precautions Precautions: Fall Restrictions Weight Bearing Restrictions: No      Mobility Bed Mobility               General bed mobility comments: seated in recliner   Transfers Overall transfer level: Needs assistance Equipment used: Rolling walker (2 wheeled) Transfers: Sit to/from Stand Sit to Stand: Supervision              Balance Overall balance assessment: Needs assistance Sitting-balance support: No upper extremity supported Sitting balance-Leahy Scale: Good     Standing balance support: Bilateral upper extremity supported Standing balance-Leahy Scale: Good                             ADL either performed or assessed with clinical judgement   ADL Overall ADL's : Needs assistance/impaired Eating/Feeding: Set up;Sitting   Grooming:  Supervision/safety;Standing Grooming Details (indicate cue type and reason): standing at sink for light grooming using LUE with decreased coordination Upper Body Bathing: Set up;Sitting   Lower Body Bathing: Min guard;Sitting/lateral leans;Sit to/from stand   Upper Body Dressing : Set up;Sitting   Lower Body Dressing: Min guard;Sitting/lateral leans;Sit to/from stand   Toilet Transfer: Min guard;Cueing for safety;Cueing for sequencing   Toileting- Water quality scientist and Hygiene: Supervision/safety;Cueing for safety;Sitting/lateral lean;Sit to/from stand       Functional mobility during ADLs: Min guard;Rolling walker;Cueing for safety General ADL Comments: minguardA for mobility and safety as LUE weakness is present. Pt limited by LUE deficits in coordination and no reports of sensation deficits at this time.     Vision Baseline Vision/History: Wears glasses Wears Glasses: Reading only Patient Visual Report: No change from baseline Vision Assessment?: No apparent visual deficits     Perception     Praxis      Pertinent Vitals/Pain Pain Assessment: 0-10 Pain Score: 3  Faces Pain Scale: Hurts little more Pain Location: back Pain Descriptors / Indicators: Aching Pain Intervention(s): Limited activity within patient's tolerance;Monitored during session     Hand Dominance Right   Extremity/Trunk Assessment Upper Extremity Assessment Upper Extremity Assessment: LUE deficits/detail RUE Sensation: decreased light touch LUE Deficits / Details: 3-/5 MM grade compared to 4/5 in RUE LUE Coordination: decreased fine motor;decreased gross motor   Lower Extremity Assessment Lower Extremity Assessment: Defer to PT evaluation RLE Sensation: decreased light touch LLE Deficits / Details: grossly 4/5 LLE Sensation: decreased light touch  Cervical / Trunk Assessment Cervical / Trunk Assessment: Normal   Communication Communication Communication: No difficulties   Cognition  Arousal/Alertness: Awake/alert Behavior During Therapy: WFL for tasks assessed/performed Overall Cognitive Status: Within Functional Limits for tasks assessed                                     General Comments  Pt given [facility] handout and pt interested in theraputty or stress ball.     Exercises Exercises: Other exercises Other Exercises Other Exercises: [facility] exercises x10 reps each   Shoulder Instructions      Home Living Family/patient expects to be discharged to:: Private residence Living Arrangements: Children Available Help at Discharge: Family;Available PRN/intermittently Type of Home: Mobile home Home Access: Stairs to enter Entrance Stairs-Number of Steps: 5 Entrance Stairs-Rails: Can reach both Home Layout: One level     Bathroom Shower/Tub: Producer, television/film/video: Standard     Home Equipment: Environmental consultant - 2 wheels;Shower seat          Prior Functioning/Environment Level of Independence: Independent with assistive device(s)        Comments: pt household ambulator with use of RW, does reports history of falls        OT Problem List: Impaired balance (sitting and/or standing);Decreased coordination      OT Treatment/Interventions: Self-care/ADL training;Neuromuscular education;Energy conservation;Therapeutic exercise;DME and/or AE instruction;Patient/family education;Balance training    OT Goals(Current goals can be found in the care plan section) Acute Rehab OT Goals Patient Stated Goal: To return to prior level of function OT Goal Formulation: With patient Time For Goal Achievement: 12/12/18 Potential to Achieve Goals: Good ADL Goals Pt Will Perform Lower Body Dressing: with modified independence;sit to/from stand Pt/caregiver will Perform Home Exercise Program: Left upper extremity;With theraputty;Independently;With written HEP provided Additional ADL Goal #1: Pt will increase to use of LUE with ADL to 90% accuracy with fine  motor coordination in 3/3 trials.  OT Frequency: Min 2X/week   Barriers to D/C:            Co-evaluation              AM-PAC OT "6 Clicks" Daily Activity     Outcome Measure Help from another person eating meals?: None Help from another person taking care of personal grooming?: None Help from another person toileting, which includes using toliet, bedpan, or urinal?: A Little Help from another person bathing (including washing, rinsing, drying)?: A Little Help from another person to put on and taking off regular upper body clothing?: None Help from another person to put on and taking off regular lower body clothing?: A Little 6 Click Score: 21   End of Session Equipment Utilized During Treatment: Gait belt;Rolling walker Nurse Communication: Mobility status  Activity Tolerance: Patient tolerated treatment well Patient left: in chair;with call bell/phone within reach;Other (comment)(echo coming into room)  OT Visit Diagnosis: Unsteadiness on feet (R26.81);Muscle weakness (generalized) (M62.81)                Time: 9518-8416 OT Time Calculation (min): 30 min Charges:  OT General Charges $OT Visit: 1 Visit OT Evaluation $OT Eval Moderate Complexity: 1 Mod OT Treatments $Neuromuscular Re-education: 8-22 mins  Revonda Standard Cecil Cranker) Glendell Docker OTR/L Acute Rehabilitation Services Pager: [phone] Office: [phone]   Gertude Benito J Joevanni Roddey 11/28/2018, 10:01 AM

## 2018-11-28 NOTE — Progress Notes (Signed)
  Echocardiogram 2D Echocardiogram has been performed.  Tiffany Schmitt 11/28/2018, 10:25 AM

## 2018-11-28 NOTE — Progress Notes (Signed)
STROKE TEAM PROGRESS NOTE   INTERVAL HISTORY Sitting up on edge of bed. No new complaints. Agrees her risk factors are not controlled. Willing to follow up. I have personally reviewed history of presenting illness with the patient, electronic medical records as well as imaging films in PACS.  She presented with several days of left-sided weakness and MRI scan shows pontine lacunar infarct.  CT angiogram brain shows diffuse multifocal intracranial atherosclerosis but without high-grade stenosis.  CTA of the neck shows moderate plaque at left carotid bifurcation with less than 50% stenosis.  Vitals:   11/28/18 0100 11/28/18 0300 11/28/18 0447 11/28/18 0746  BP: (!) 157/70 (!) 160/91 (!) 157/74 (!) 157/82  Pulse:  60 (!) 57 64  Resp: 16 14 (!) 8 14  Temp: 98.5 F (36.9 C) 98.3 F (36.8 C) 98.6 F (37 C) 98.4 F (36.9 C)  TempSrc: Oral Oral Oral Oral  SpO2: 97% 98% 97% 100%  Weight:   99.1 kg   Height:        CBC:  Recent Labs  Lab 11/27/18 1323 11/28/18 0334  WBC 10.0 8.5  NEUTROABS 6.6  --   HGB 13.3 11.9*  HCT 41.8 35.5*  MCV 85.3 82.6  PLT 231 236    Basic Metabolic Panel:  Recent Labs  Lab 11/27/18 1353 11/28/18 0334  NA 137 137  K 3.8 3.9  CL 107 103  CO2 22 21*  GLUCOSE 181* 227*  BUN 21* 17  CREATININE 1.10* 1.12*  CALCIUM 8.7* 8.8*   Lipid Panel:     Component Value Date/Time   CHOL 272 (H) 11/28/2018 0334   TRIG 332 (H) 11/28/2018 0334   HDL 40 (L) 11/28/2018 0334   CHOLHDL 6.8 11/28/2018 0334   VLDL 66 (H) 11/28/2018 0334   LDLCALC 166 (H) 11/28/2018 0334   HgbA1c:  Lab Results  Component Value Date   HGBA1C 11.3 (H) 11/28/2018   Urine Drug Screen:     Component Value Date/Time   LABOPIA NONE DETECTED 11/27/2018 1640   COCAINSCRNUR NONE DETECTED 11/27/2018 1640   LABBENZ NONE DETECTED 11/27/2018 1640   AMPHETMU NONE DETECTED 11/27/2018 1640   THCU NONE DETECTED 11/27/2018 1640   LABBARB NONE DETECTED 11/27/2018 1640    Alcohol Level      Component Value Date/Time   ETH <10 11/27/2018 1323    IMAGING Ct Angio Head W Or Wo Contrast  Result Date: 11/27/2018 CLINICAL DATA:  Focal neuro deficit, greater than 6 hours, stroke suspected. Additional history provided: Patient presents with left-sided weakness that began Saturday unknown time. Reports headache on Friday and not feeling well throughout the weekend, feeling off balance and heaviness of left side. EXAM: CT ANGIOGRAPHY HEAD AND NECK TECHNIQUE: Multidetector CT imaging of the head and neck was performed using the standard protocol during bolus administration of intravenous contrast. Multiplanar CT image reconstructions and MIPs were obtained to evaluate the vascular anatomy. Carotid stenosis measurements (when applicable) are obtained utilizing NASCET criteria, using the distal internal carotid diameter as the denominator. CONTRAST:  75mL OMNIPAQUE IOHEXOL 350 MG/ML SOLN COMPARISON:  Noncontrast head CT performed earlier the same day 11/27/2018. FINDINGS: CT HEAD FINDINGS No evidence of acute intracranial hemorrhage. No demarcated cortical infarction. No evidence of intracranial mass. No midline shift or extra-axial fluid collection. Cerebral volume is normal for age. Vascular: Reported separately Skull: Normal. Negative for fracture or focal lesion. Sinuses: No significant paranasal sinus disease or mastoid effusion. Orbits: Visualized orbits demonstrate no acute abnormality. Review of the  MIP images confirms the above findings CTA NECK FINDINGS Aortic arch: Common origin of the innominate and left common carotid arteries. Included portions of the aortic arch demonstrate no evidence of dissection or aneurysm. Atherosclerotic plaque within the visualized aortic arch and proximal major branch vessels of the neck. Right carotid system: Common and internal carotid arteries patent within the neck without stenosis. Minimal soft and calcified plaque within the proximal ICA. Left carotid system:  Common carotid artery patent without stenosis. There is significant plaque within the carotid bifurcation and proximal ICA. It is difficult to accurately measure the degree of stenosis at the origin of the left ICA due to irregularity of soft and calcified plaque at this site. There may be greater than 50% stenosis at the origin of the left ICA. Distal to this the ICA is patent within the neck without significant stenosis. Vertebral arteries: Right vertebral artery dominant. The vertebral arteries are patent within the neck bilaterally without significant stenosis. Skeleton: No acute bony abnormality. Cervical spondylosis greatest at C5-C6 and C6-C7 where there is moderate disc degeneration small posterior disc osteophytes. Other neck: No soft tissue neck mass or pathologically enlarged cervical chain lymph nodes. Thyroid negative. Upper chest: Patchy ground-glass opacity/mosaic attenuation within the imaged lung apices may reflect mild edema or sequela of small airways disease. Review of the MIP images confirms the above findings CTA HEAD FINDINGS Anterior circulation: Soft and calcified plaque within the cavernous and paraclinoid right internal carotid artery. Sites of mild to moderate stenosis within the cavernous right ICA. Moderate focal stenosis within the paraclinoid right ICA (series 11, image 247). Soft and calcified plaque within the cavernous and paraclinoid left internal carotid artery. Sites of at least moderate stenosis within the cavernous left ICA. The right middle and anterior cerebral arteries are patent without significant proximal stenosis. The left middle and anterior cerebral arteries are patent without significant proximal stenosis. No intracranial aneurysm is identified. Posterior circulation: Significant soft and calcified plaque within the intracranial right vertebral artery with sites of up to moderate stenosis. The left vertebral artery is patent without significant stenosis.  Atherosclerotic irregularity of the basilar artery with noncalcified plaque. Focal moderate stenosis within the mid to distal basilar artery. Significant atherosclerotic irregularity of the bilateral posterior cerebral arteries. Mild-to-moderate focal stenosis within the right PCA at the P1-P2 junction. Moderate/severe focal stenosis within the P2 left posterior cerebral artery. Venous sinuses: Within limitations of contrast timing, no convincing thrombus. Anatomic variants: Posterior cerebral arteries are poorly delineated bilaterally and may be hypoplastic or absent. Review of the MIP images confirms the above findings IMPRESSION: CT head: No CT evidence of acute intracranial abnormality. Consider brain MRI for further evaluation, as clinically warranted. CTA head: 1. Intracranial atherosclerotic disease with multifocal stenoses, most notably as follows. 2. Moderate stenosis within the paraclinoid right internal carotid artery. 3. Sites of at least moderate stenosis within the cavernous left internal carotid artery. 4. Sites of up to moderate stenosis within the intracranial right vertebral artery. 5. Focal moderate stenosis within the mid to distal basilar artery. 6. Focal moderate/severe stenosis within the P2 left posterior cerebral artery. CTA neck: 1. Common and internal carotid arteries patent within the neck bilaterally. It is difficult to accurately quantify the degree of stenosis at the origin of the left ICA due to irregularity of prominent soft and calcified plaque at this site. Greater than 50% stenosis may be present. Carotid artery duplex recommended for further evaluation. 2. Bilateral vertebral arteries patent within the neck without stenosis.  3. Patchy ground-glass opacity/mosaic attenuation within the imaged lung apices, which may reflect edema or sequela of small airways disease. Electronically Signed   By: Jackey Loge   On: 11/27/2018 18:56   Ct Head Wo Contrast  Result Date:  11/27/2018 CLINICAL DATA:  Left-sided weakness that began Saturday EXAM: CT HEAD WITHOUT CONTRAST TECHNIQUE: Contiguous axial images were obtained from the base of the skull through the vertex without intravenous contrast. COMPARISON:  None. FINDINGS: Brain: No evidence of acute infarction, hemorrhage, hydrocephalus, extra-axial collection or mass lesion/mass effect. Vascular: There are vascular calcifications in the carotid siphons. Skull: Normal. Negative for fracture or focal lesion. Sinuses/Orbits: No acute finding. Other: None. IMPRESSION: No acute intracranial process. Electronically Signed   By: Romona Curls M.D.   On: 11/27/2018 14:04   Ct Angio Neck W And/or Wo Contrast  Result Date: 11/27/2018 CLINICAL DATA:  Focal neuro deficit, greater than 6 hours, stroke suspected. Additional history provided: Patient presents with left-sided weakness that began Saturday unknown time. Reports headache on Friday and not feeling well throughout the weekend, feeling off balance and heaviness of left side. EXAM: CT ANGIOGRAPHY HEAD AND NECK TECHNIQUE: Multidetector CT imaging of the head and neck was performed using the standard protocol during bolus administration of intravenous contrast. Multiplanar CT image reconstructions and MIPs were obtained to evaluate the vascular anatomy. Carotid stenosis measurements (when applicable) are obtained utilizing NASCET criteria, using the distal internal carotid diameter as the denominator. CONTRAST:  26mL OMNIPAQUE IOHEXOL 350 MG/ML SOLN COMPARISON:  Noncontrast head CT performed earlier the same day 11/27/2018. FINDINGS: CT HEAD FINDINGS No evidence of acute intracranial hemorrhage. No demarcated cortical infarction. No evidence of intracranial mass. No midline shift or extra-axial fluid collection. Cerebral volume is normal for age. Vascular: Reported separately Skull: Normal. Negative for fracture or focal lesion. Sinuses: No significant paranasal sinus disease or mastoid  effusion. Orbits: Visualized orbits demonstrate no acute abnormality. Review of the MIP images confirms the above findings CTA NECK FINDINGS Aortic arch: Common origin of the innominate and left common carotid arteries. Included portions of the aortic arch demonstrate no evidence of dissection or aneurysm. Atherosclerotic plaque within the visualized aortic arch and proximal major branch vessels of the neck. Right carotid system: Common and internal carotid arteries patent within the neck without stenosis. Minimal soft and calcified plaque within the proximal ICA. Left carotid system: Common carotid artery patent without stenosis. There is significant plaque within the carotid bifurcation and proximal ICA. It is difficult to accurately measure the degree of stenosis at the origin of the left ICA due to irregularity of soft and calcified plaque at this site. There may be greater than 50% stenosis at the origin of the left ICA. Distal to this the ICA is patent within the neck without significant stenosis. Vertebral arteries: Right vertebral artery dominant. The vertebral arteries are patent within the neck bilaterally without significant stenosis. Skeleton: No acute bony abnormality. Cervical spondylosis greatest at C5-C6 and C6-C7 where there is moderate disc degeneration small posterior disc osteophytes. Other neck: No soft tissue neck mass or pathologically enlarged cervical chain lymph nodes. Thyroid negative. Upper chest: Patchy ground-glass opacity/mosaic attenuation within the imaged lung apices may reflect mild edema or sequela of small airways disease. Review of the MIP images confirms the above findings CTA HEAD FINDINGS Anterior circulation: Soft and calcified plaque within the cavernous and paraclinoid right internal carotid artery. Sites of mild to moderate stenosis within the cavernous right ICA. Moderate focal stenosis within the  paraclinoid right ICA (series 11, image 247). Soft and calcified plaque  within the cavernous and paraclinoid left internal carotid artery. Sites of at least moderate stenosis within the cavernous left ICA. The right middle and anterior cerebral arteries are patent without significant proximal stenosis. The left middle and anterior cerebral arteries are patent without significant proximal stenosis. No intracranial aneurysm is identified. Posterior circulation: Significant soft and calcified plaque within the intracranial right vertebral artery with sites of up to moderate stenosis. The left vertebral artery is patent without significant stenosis. Atherosclerotic irregularity of the basilar artery with noncalcified plaque. Focal moderate stenosis within the mid to distal basilar artery. Significant atherosclerotic irregularity of the bilateral posterior cerebral arteries. Mild-to-moderate focal stenosis within the right PCA at the P1-P2 junction. Moderate/severe focal stenosis within the P2 left posterior cerebral artery. Venous sinuses: Within limitations of contrast timing, no convincing thrombus. Anatomic variants: Posterior cerebral arteries are poorly delineated bilaterally and may be hypoplastic or absent. Review of the MIP images confirms the above findings IMPRESSION: CT head: No CT evidence of acute intracranial abnormality. Consider brain MRI for further evaluation, as clinically warranted. CTA head: 1. Intracranial atherosclerotic disease with multifocal stenoses, most notably as follows. 2. Moderate stenosis within the paraclinoid right internal carotid artery. 3. Sites of at least moderate stenosis within the cavernous left internal carotid artery. 4. Sites of up to moderate stenosis within the intracranial right vertebral artery. 5. Focal moderate stenosis within the mid to distal basilar artery. 6. Focal moderate/severe stenosis within the P2 left posterior cerebral artery. CTA neck: 1. Common and internal carotid arteries patent within the neck bilaterally. It is difficult  to accurately quantify the degree of stenosis at the origin of the left ICA due to irregularity of prominent soft and calcified plaque at this site. Greater than 50% stenosis may be present. Carotid artery duplex recommended for further evaluation. 2. Bilateral vertebral arteries patent within the neck without stenosis. 3. Patchy ground-glass opacity/mosaic attenuation within the imaged lung apices, which may reflect edema or sequela of small airways disease. Electronically Signed   By: Jackey Loge   On: 11/27/2018 18:56   Mr Brain Wo Contrast  Result Date: 11/27/2018 CLINICAL DATA:  58 year old female with left side weakness for several days. EXAM: MRI HEAD WITHOUT CONTRAST TECHNIQUE: Multiplanar, multiecho pulse sequences of the brain and surrounding structures were obtained without intravenous contrast. COMPARISON:  CT head and CTA head and neck earlier today. FINDINGS: Brain: Confluent restricted diffusion in the right pons extending to the midline in an area of about 12 millimeters (series 5, image 8). Associated T2 and FLAIR hyperintensity, and T1 hypointensity, with no hemorrhage or mass effect. Additional T2/FLAIR heterogeneity elsewhere in the pons, and also in the bilateral cerebellar peduncles (confluent on the right). But no other restricted diffusion. No midline shift, mass effect, evidence of mass lesion, ventriculomegaly, extra-axial collection or acute intracranial hemorrhage. Cervicomedullary junction and pituitary are within normal limits. Supratentorial scattered mostly subcortical white matter T2 and FLAIR hyperintensity is mild to moderate for age. No supratentorial cortical encephalomalacia. No chronic blood products. The deep gray nuclei remain normal. Vascular: Major intracranial vascular flow voids are preserved. Skull and upper cervical spine: Negative visible cervical spine. Visualized bone marrow signal is within normal limits. Sinuses/Orbits: Negative. Other: Mastoids are clear.  Visible internal auditory structures appear normal. Negative scalp and face soft tissues. IMPRESSION: 1. Acute to subacute brainstem infarct in the right pons. No associated hemorrhage or mass effect. 2. Additional chronic appearing  signal changes in the pons, and also confluent abnormal signal in the cerebellar peduncleswhich is felt due to developing Wallerian degeneration. 3. Comparatively mild supratentorial white matter signal changes. Electronically Signed   By: Genevie Ann M.D.   On: 11/27/2018 21:33    PHYSICAL EXAM Pleasant middle-aged lady currently not in distress. . Afebrile. Head is nontraumatic. Neck is supple without bruit.    Cardiac exam no murmur or gallop. Lungs are clear to auscultation. Distal pulses are well felt. Neurological Exam  Awake alert oriented x 3 normal speech and language. Mild left lower face asymmetry. Tongue midline. Mild LUE and LLE drift.left hemiplegia 4/5 strength. Mild diminished fine finger movements on left. Orbits right over left upper extremity. Mild left grip weak.. Normal sensation . Normal coordination. ASSESSMENT/PLAN Tiffany Schmitt is a 58 y.o. female with history of HTN, DB presenting to [facility] with L sided weakness x 5 days, had HA at onset..    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   CTA head intracranial atherosclerotic dz (moderate R paraclinoid ICA, L cavernous ICA, R VA, mid to distal BA, and L P2 moderate to severe)  CTA neck L ICA origin >/= 50. Patchy ground glass lung apices  MRI  R pontine infarct. Developing Wallerian degeneration. Mild supratentorial white matter changes  2D Echo EF 60-65%. No source of embolus    LDL 166  HgbA1c 11.3  Lovenox 40 mg sq daily for VTE prophylaxis  No antithrombotic prior to admission, now on aspirin 325 mg daily. Plan DAPT x 3 weeks then aspirin alone. Orders adjusted.   Therapy recommendations:  HH PT, HH OT, no  SLP  Disposition:  Return home [place] for d/c from stroke standpoint Follow-up Stroke Clinic at [facility] in 4 weeks. Office will call with appointment date and time. Order placed.  Hypertension  Stable . Permissive hypertension (OK if < 220/120) but gradually normalize in 5-7 days . Long-term BP goal normotensive  Hyperlipidemia  Home meds:  No statin   Now on lipitor 40  LDL 166, goal < 70  Continue statin at discharge  Diabetes type II Uncontrolled  HgbA1c 11.3, goal < 7.0  Other Stroke Risk Factors  Obesity, Body mass index is 38.7 kg/m., recommend weight loss, diet and exercise as appropriate   Hospital day # 0 I have personally obtained history,examined this patient, reviewed notes, independently viewed imaging studies, participated in medical decision making and plan of care.ROS completed by me personally and pertinent positives fully documented  I have made any additions or clarifications directly to the above note.  She presented with several days of left sided weakness secondary to right pontine infarct from small vessel disease.  Recommend aspirin Plavix for 3 weeks followed by aspirin alone and aggressive risk factor modification.  Check echocardiogram results and she may be discharged after that.  Patient was counseled to eat a healthy diet and exercise regularly and be compliant with her follow-up and is agreeable.  Discussed with Dr. Doristine Bosworth.  Greater than 50% time during this 35-minute visit was spent on counseling and coordination of care about her lacunar stroke and discussion about stroke risk factors, prevention and treatment and answering questions  Antony Contras, MD Medical Director [facility] Pager: [phone] 11/28/2018 3:18 PM   To contact Stroke Continuity provider, please refer to [email or web address] After hours, contact General Neurology

## 2018-11-28 NOTE — Progress Notes (Signed)
PROGRESS NOTE    Tiffany Schmitt  ZOX:[id number] DOB: 04-01-1960 DOA: 11/27/2018 PCP: Patient, No Pcp Per   Brief Narrative:  Tiffany Schmitt is a 58 y.o. female with medical history significant for type 2 diabetes and hypertension who presented to [facility] ED for evaluation of left-sided weakness.  Patient states she was in her usual state of health until 11/23/2018.  She reports experiencing left-sided headache afterwards with some nausea, lightheadedness, dizziness without syncope and occasional palpitations without chest pain or dyspnea.  No other complaint.  Upon arrival to ED, she was hemodynamically stable.  UA and UDS were negative.  CovID test negative. CT head without contrast was negative for acute infarction or intracranial hemorrhage.  CTA head/neck showed intracranial atherosclerotic disease with multifocal stenoses with moderate stenosis within the paraclinoid right ICA, moderate stenosis within cavernous left ICA, moderate stenosis within the intracranial right vertebral artery, focal moderate stenosis within the mid to distal basilar artery, and focal moderate/severe stenosis within the P2 left posterior cerebral artery.  The common and internal carotid arteries are patent within the neck bilaterally, radiology report suspects greater than 50% stenosis at the origin of the left ICA.  Bilateral vertebral arteries were patent within the neck.  Neurology were consulted and recommended ED to ED transfer for stat MRI brain and medical admission.  Patient was then admitted at [facility] under hospitalist service with neurology consultation.  MRI showed acute to subacute brainstem infarct in the right pons with no hemorrhage or mass-effect.  Additional chronic appearing signal change in the pons and also confluent abnormal signal in the cerebellar peduncles.  Patient was started on aspirin and Plavix.  Assessment & Plan:   Principal Problem:   Left-sided  weakness Active Problems:   Diabetes mellitus without complication (HCC)   Hypertension associated with diabetes (HCC)   Acute ischemic vertebrobasilar artery brainstem stroke, right (HCC)   Acute ischemic stroke (HCC)  Acute/subacute ischemic pontine stroke: Patient feels better.  Still with left-sided weakness but this has improved according to patient.  No dysarthria or any swallowing issue.  She was seen by neurology and they recommended continuing statin, aspirin and Plavix and they have cleared her for discharge.  She was seen by PT OT and they recommended home health.  She is not comfortable going home today.  We will keep her overnight for observation.  Echo did not show any ASD/VSD.  Essential hypertension: Blood pressure slightly elevated.  Allow permissive hypertension.  Continue to hold antihypertensives.  Type 2 diabetes mellitus: She is only on metformin and Trulicity.  According to her, her hemoglobin A1c 5 months ago was over 11.  She is over 11 here again 2.  She tells me that her fasting blood sugar usually runs around 250.  She sees endocrinologist as an outpatient.  She needs more than what she is taking now.  At this point in time while she is hospitalized, I am going to start her on Lantus 15 units and continue SSI.  Hyperlipidemia: Atorvastatin 40 mg p.o. daily.  DVT prophylaxis: Lovenox Code Status: Full code Family Communication:  None present at bedside.  Plan of care discussed with patient in length and he verbalized understanding and agreed with it. Disposition Plan: Potential discharge tomorrow home.  Estimated body mass index is 38.7 kg/m as calculated from the following:   Height as of this encounter:  (1.6 m).   Weight as of this encounter: 99.1 kg.      Nutritional  status:               Consultants:   Neurology  Procedures:   None  Antimicrobials:   None   Subjective: Patient seen and examined.  Continues to have left-sided  weakness but this is improving.  No new complaint.  She had several questions about why she had a stroke while she eats healthy diet most of the times.  I did explain to her that uncontrolled diabetes in combination with hypertension and hyperlipidemia greatly increases the risk of stroke.  Objective: Vitals:   11/28/18 0300 11/28/18 0447 11/28/18 0746 11/28/18 1152  BP: (!) 160/91 (!) 157/74 (!) 157/82 (!) 162/81  Pulse: 60 (!) 57 64 63  Resp: 14 (!) Temp: 98.3 F (36.8 C) 98.6 F (37 C) 98.4 F (36.9 C) 98.7 F (37.1 C)  TempSrc: Oral Oral Oral Oral  SpO2: 98% 97% 100% 100%  Weight:  99.1 kg    Height:        Intake/Output Summary (Last 24 hours) at 11/28/2018 1526 Last data filed at 11/28/2018 1030 Gross per 24 hour  Intake 1240 ml  Output --  Net 1240 ml   Filed Weights   11/27/18 1318 11/28/18 0447  Weight: 99.8 kg 99.1 kg    Examination:  General exam: Appears calm and comfortable  Respiratory system: Clear to auscultation. Respiratory effort normal. Cardiovascular system: S1 & S2 heard, RRR. No JVD, murmurs, rubs, gallops or clicks. No pedal edema. Gastrointestinal system: Abdomen is nondistended, soft and nontender. No organomegaly or masses felt. Normal bowel sounds heard. Central nervous system: Alert and oriented.  4/5 power in left upper and lower extremity. Extremities: Symmetric 5 x 5 power. Skin: No rashes, lesions or ulcers Psychiatry: Judgement and insight appear normal. Mood & affect appropriate.    Data Reviewed: I have personally reviewed following labs and imaging studies  CBC: Recent Labs  Lab 11/27/18 1323 11/28/18 0334  WBC 10.0 8.5  NEUTROABS 6.6  --   HGB 13.3 11.9*  HCT 41.8 35.5*  MCV 85.3 82.6  PLT 231 236   Basic Metabolic Panel: Recent Labs  Lab 11/27/18 1353 11/28/18 0334  NA 137 137  K 3.8 3.9  CL 107 103  CO2 22 21*  GLUCOSE 181* 227*  BUN 21* 17  CREATININE 1.10* 1.12*  CALCIUM 8.7* 8.8*    GFR: Estimated Creatinine Clearance: 62.2 mL/min (A) (by C-G formula based on SCr of 1.12 mg/dL (H)). Liver Function Tests: Recent Labs  Lab 11/27/18 1353  AST 19  ALT 18  ALKPHOS 78  BILITOT 0.3  PROT 6.4*  ALBUMIN 3.3*   No results for input(s): LIPASE, AMYLASE in the last 168 hours. No results for input(s): AMMONIA in the last 168 hours. Coagulation Profile: Recent Labs  Lab 11/27/18 1353  INR 1.0   Cardiac Enzymes: No results for input(s): CKTOTAL, CKMB, CKMBINDEX, TROPONINI in the last 168 hours. BNP (last 3 results) No results for input(s): PROBNP in the last 8760 hours. HbA1C: Recent Labs    11/28/18 0334  HGBA1C 11.3*   CBG: Recent Labs  Lab 11/27/18 1313 11/27/18 2304 11/28/18 0649 11/28/18 1357  GLUCAP 215* 198* 228* 298*   Lipid Profile: Recent Labs    11/28/18 0334  CHOL 272*  HDL 40*  LDLCALC 166*  TRIG 332*  CHOLHDL 6.8   Thyroid Function Tests: No results for input(s): TSH, T4TOTAL, FREET4, T3FREE, THYROIDAB in the last 72 hours. Anemia Panel: No results for  input(s): VITAMINB12, FOLATE, FERRITIN, TIBC, IRON, RETICCTPCT in the last 72 hours. Sepsis Labs: No results for input(s): PROCALCITON, LATICACIDVEN in the last 168 hours.  Recent Results (from the past 240 hour(s))  SARS Coronavirus 2 by RT PCR (hospital order, performed in [facility] hospital lab) Nasopharyngeal Nasopharyngeal Swab     Status: None   Collection Time: 11/27/18  5:24 PM   Specimen: Nasopharyngeal Swab  Result Value Ref Range Status   SARS Coronavirus 2 NEGATIVE NEGATIVE Final    Comment: (NOTE) If result is NEGATIVE SARS-CoV-2 target nucleic acids are NOT DETECTED. The SARS-CoV-2 RNA is generally detectable in upper and lower  respiratory specimens during the acute phase of infection. The lowest  concentration of SARS-CoV-2 viral copies this assay can detect is 250  copies / mL. A negative result does not preclude SARS-CoV-2 infection  and should not be  used as the sole basis for treatment or other  patient management decisions.  A negative result may occur with  improper specimen collection / handling, submission of specimen other  than nasopharyngeal swab, presence of viral mutation(s) within the  areas targeted by this assay, and inadequate number of viral copies  (<250 copies / mL). A negative result must be combined with clinical  observations, patient history, and epidemiological information. If result is POSITIVE SARS-CoV-2 target nucleic acids are DETECTED. The SARS-CoV-2 RNA is generally detectable in upper and lower  respiratory specimens dur ing the acute phase of infection.  Positive  results are indicative of active infection with SARS-CoV-2.  Clinical  correlation with patient history and other diagnostic information is  necessary to determine patient infection status.  Positive results do  not rule out bacterial infection or co-infection with other viruses. If result is PRESUMPTIVE POSTIVE SARS-CoV-2 nucleic acids MAY BE PRESENT.   A presumptive positive result was obtained on the submitted specimen  and confirmed on repeat testing.  While 2019 novel coronavirus  (SARS-CoV-2) nucleic acids may be present in the submitted sample  additional confirmatory testing may be necessary for epidemiological  and / or clinical management purposes  to differentiate between  SARS-CoV-2 and other Sarbecovirus currently known to infect humans.  If clinically indicated additional testing with an alternate test  methodology [phone]) is advised. The SARS-CoV-2 RNA is generally  detectable in upper and lower respiratory sp ecimens during the acute  phase of infection. The expected result is Negative. Fact Sheet for Patients:  BoilerBrush.com.cy Fact Sheet for Healthcare Providers: https://pope.com/ This test is not yet approved or cleared by the [place] FDA and has been authorized  for detection and/or diagnosis of SARS-CoV-2 by FDA under an Emergency Use Authorization (EUA).  This EUA will remain in effect (meaning this test can be used) for the duration of the COVID-19 declaration under Section 564(b)(1) of the Act, 21 U.S.C. section 360bbb-3(b)(1), unless the authorization is terminated or revoked sooner. Performed at [facility], [address], [place], [place] [address]       Radiology Studies: Ct Angio Head W Or Wo Contrast  Result Date: 11/27/2018 CLINICAL DATA:  Focal neuro deficit, greater than 6 hours, stroke suspected. Additional history provided: Patient presents with left-sided weakness that began Saturday unknown time. Reports headache on Friday and not feeling well throughout the weekend, feeling off balance and heaviness of left side. EXAM: CT ANGIOGRAPHY HEAD AND NECK TECHNIQUE: Multidetector CT imaging of the head and neck was performed using the standard protocol during bolus administration of intravenous contrast.  Multiplanar CT image reconstructions and MIPs were obtained to evaluate the vascular anatomy. Carotid stenosis measurements (when applicable) are obtained utilizing NASCET criteria, using the distal internal carotid diameter as the denominator. CONTRAST:  55mL OMNIPAQUE IOHEXOL 350 MG/ML SOLN COMPARISON:  Noncontrast head CT performed earlier the same day 11/27/2018. FINDINGS: CT HEAD FINDINGS No evidence of acute intracranial hemorrhage. No demarcated cortical infarction. No evidence of intracranial mass. No midline shift or extra-axial fluid collection. Cerebral volume is normal for age. Vascular: Reported separately Skull: Normal. Negative for fracture or focal lesion. Sinuses: No significant paranasal sinus disease or mastoid effusion. Orbits: Visualized orbits demonstrate no acute abnormality. Review of the MIP images confirms the above findings CTA NECK FINDINGS Aortic arch: Common origin of the innominate and left common carotid  arteries. Included portions of the aortic arch demonstrate no evidence of dissection or aneurysm. Atherosclerotic plaque within the visualized aortic arch and proximal major branch vessels of the neck. Right carotid system: Common and internal carotid arteries patent within the neck without stenosis. Minimal soft and calcified plaque within the proximal ICA. Left carotid system: Common carotid artery patent without stenosis. There is significant plaque within the carotid bifurcation and proximal ICA. It is difficult to accurately measure the degree of stenosis at the origin of the left ICA due to irregularity of soft and calcified plaque at this site. There may be greater than 50% stenosis at the origin of the left ICA. Distal to this the ICA is patent within the neck without significant stenosis. Vertebral arteries: Right vertebral artery dominant. The vertebral arteries are patent within the neck bilaterally without significant stenosis. Skeleton: No acute bony abnormality. Cervical spondylosis greatest at C5-C6 and C6-C7 where there is moderate disc degeneration small posterior disc osteophytes. Other neck: No soft tissue neck mass or pathologically enlarged cervical chain lymph nodes. Thyroid negative. Upper chest: Patchy ground-glass opacity/mosaic attenuation within the imaged lung apices may reflect mild edema or sequela of small airways disease. Review of the MIP images confirms the above findings CTA HEAD FINDINGS Anterior circulation: Soft and calcified plaque within the cavernous and paraclinoid right internal carotid artery. Sites of mild to moderate stenosis within the cavernous right ICA. Moderate focal stenosis within the paraclinoid right ICA (series 11, image 247). Soft and calcified plaque within the cavernous and paraclinoid left internal carotid artery. Sites of at least moderate stenosis within the cavernous left ICA. The right middle and anterior cerebral arteries are patent without significant  proximal stenosis. The left middle and anterior cerebral arteries are patent without significant proximal stenosis. No intracranial aneurysm is identified. Posterior circulation: Significant soft and calcified plaque within the intracranial right vertebral artery with sites of up to moderate stenosis. The left vertebral artery is patent without significant stenosis. Atherosclerotic irregularity of the basilar artery with noncalcified plaque. Focal moderate stenosis within the mid to distal basilar artery. Significant atherosclerotic irregularity of the bilateral posterior cerebral arteries. Mild-to-moderate focal stenosis within the right PCA at the P1-P2 junction. Moderate/severe focal stenosis within the P2 left posterior cerebral artery. Venous sinuses: Within limitations of contrast timing, no convincing thrombus. Anatomic variants: Posterior cerebral arteries are poorly delineated bilaterally and may be hypoplastic or absent. Review of the MIP images confirms the above findings IMPRESSION: CT head: No CT evidence of acute intracranial abnormality. Consider brain MRI for further evaluation, as clinically warranted. CTA head: 1. Intracranial atherosclerotic disease with multifocal stenoses, most notably as follows. 2. Moderate stenosis within the paraclinoid right internal carotid artery. 3. Sites  of at least moderate stenosis within the cavernous left internal carotid artery. 4. Sites of up to moderate stenosis within the intracranial right vertebral artery. 5. Focal moderate stenosis within the mid to distal basilar artery. 6. Focal moderate/severe stenosis within the P2 left posterior cerebral artery. CTA neck: 1. Common and internal carotid arteries patent within the neck bilaterally. It is difficult to accurately quantify the degree of stenosis at the origin of the left ICA due to irregularity of prominent soft and calcified plaque at this site. Greater than 50% stenosis may be present. Carotid artery duplex  recommended for further evaluation. 2. Bilateral vertebral arteries patent within the neck without stenosis. 3. Patchy ground-glass opacity/mosaic attenuation within the imaged lung apices, which may reflect edema or sequela of small airways disease. Electronically Signed   By: Jackey Loge   On: 11/27/2018 18:56   Ct Head Wo Contrast  Result Date: 11/27/2018 CLINICAL DATA:  Left-sided weakness that began Saturday EXAM: CT HEAD WITHOUT CONTRAST TECHNIQUE: Contiguous axial images were obtained from the base of the skull through the vertex without intravenous contrast. COMPARISON:  None. FINDINGS: Brain: No evidence of acute infarction, hemorrhage, hydrocephalus, extra-axial collection or mass lesion/mass effect. Vascular: There are vascular calcifications in the carotid siphons. Skull: Normal. Negative for fracture or focal lesion. Sinuses/Orbits: No acute finding. Other: None. IMPRESSION: No acute intracranial process. Electronically Signed   By: Romona Curls M.D.   On: 11/27/2018 14:04   Ct Angio Neck W And/or Wo Contrast  Result Date: 11/27/2018 CLINICAL DATA:  Focal neuro deficit, greater than 6 hours, stroke suspected. Additional history provided: Patient presents with left-sided weakness that began Saturday unknown time. Reports headache on Friday and not feeling well throughout the weekend, feeling off balance and heaviness of left side. EXAM: CT ANGIOGRAPHY HEAD AND NECK TECHNIQUE: Multidetector CT imaging of the head and neck was performed using the standard protocol during bolus administration of intravenous contrast. Multiplanar CT image reconstructions and MIPs were obtained to evaluate the vascular anatomy. Carotid stenosis measurements (when applicable) are obtained utilizing NASCET criteria, using the distal internal carotid diameter as the denominator. CONTRAST:  75mL OMNIPAQUE IOHEXOL 350 MG/ML SOLN COMPARISON:  Noncontrast head CT performed earlier the same day 11/27/2018. FINDINGS: CT  HEAD FINDINGS No evidence of acute intracranial hemorrhage. No demarcated cortical infarction. No evidence of intracranial mass. No midline shift or extra-axial fluid collection. Cerebral volume is normal for age. Vascular: Reported separately Skull: Normal. Negative for fracture or focal lesion. Sinuses: No significant paranasal sinus disease or mastoid effusion. Orbits: Visualized orbits demonstrate no acute abnormality. Review of the MIP images confirms the above findings CTA NECK FINDINGS Aortic arch: Common origin of the innominate and left common carotid arteries. Included portions of the aortic arch demonstrate no evidence of dissection or aneurysm. Atherosclerotic plaque within the visualized aortic arch and proximal major branch vessels of the neck. Right carotid system: Common and internal carotid arteries patent within the neck without stenosis. Minimal soft and calcified plaque within the proximal ICA. Left carotid system: Common carotid artery patent without stenosis. There is significant plaque within the carotid bifurcation and proximal ICA. It is difficult to accurately measure the degree of stenosis at the origin of the left ICA due to irregularity of soft and calcified plaque at this site. There may be greater than 50% stenosis at the origin of the left ICA. Distal to this the ICA is patent within the neck without significant stenosis. Vertebral arteries: Right vertebral artery dominant. The  vertebral arteries are patent within the neck bilaterally without significant stenosis. Skeleton: No acute bony abnormality. Cervical spondylosis greatest at C5-C6 and C6-C7 where there is moderate disc degeneration small posterior disc osteophytes. Other neck: No soft tissue neck mass or pathologically enlarged cervical chain lymph nodes. Thyroid negative. Upper chest: Patchy ground-glass opacity/mosaic attenuation within the imaged lung apices may reflect mild edema or sequela of small airways disease. Review  of the MIP images confirms the above findings CTA HEAD FINDINGS Anterior circulation: Soft and calcified plaque within the cavernous and paraclinoid right internal carotid artery. Sites of mild to moderate stenosis within the cavernous right ICA. Moderate focal stenosis within the paraclinoid right ICA (series 11, image 247). Soft and calcified plaque within the cavernous and paraclinoid left internal carotid artery. Sites of at least moderate stenosis within the cavernous left ICA. The right middle and anterior cerebral arteries are patent without significant proximal stenosis. The left middle and anterior cerebral arteries are patent without significant proximal stenosis. No intracranial aneurysm is identified. Posterior circulation: Significant soft and calcified plaque within the intracranial right vertebral artery with sites of up to moderate stenosis. The left vertebral artery is patent without significant stenosis. Atherosclerotic irregularity of the basilar artery with noncalcified plaque. Focal moderate stenosis within the mid to distal basilar artery. Significant atherosclerotic irregularity of the bilateral posterior cerebral arteries. Mild-to-moderate focal stenosis within the right PCA at the P1-P2 junction. Moderate/severe focal stenosis within the P2 left posterior cerebral artery. Venous sinuses: Within limitations of contrast timing, no convincing thrombus. Anatomic variants: Posterior cerebral arteries are poorly delineated bilaterally and may be hypoplastic or absent. Review of the MIP images confirms the above findings IMPRESSION: CT head: No CT evidence of acute intracranial abnormality. Consider brain MRI for further evaluation, as clinically warranted. CTA head: 1. Intracranial atherosclerotic disease with multifocal stenoses, most notably as follows. 2. Moderate stenosis within the paraclinoid right internal carotid artery. 3. Sites of at least moderate stenosis within the cavernous left  internal carotid artery. 4. Sites of up to moderate stenosis within the intracranial right vertebral artery. 5. Focal moderate stenosis within the mid to distal basilar artery. 6. Focal moderate/severe stenosis within the P2 left posterior cerebral artery. CTA neck: 1. Common and internal carotid arteries patent within the neck bilaterally. It is difficult to accurately quantify the degree of stenosis at the origin of the left ICA due to irregularity of prominent soft and calcified plaque at this site. Greater than 50% stenosis may be present. Carotid artery duplex recommended for further evaluation. 2. Bilateral vertebral arteries patent within the neck without stenosis. 3. Patchy ground-glass opacity/mosaic attenuation within the imaged lung apices, which may reflect edema or sequela of small airways disease. Electronically Signed   By: Jackey Loge   On: 11/27/2018 18:56   Mr Brain Wo Contrast  Result Date: 11/27/2018 CLINICAL DATA:  58 year old female with left side weakness for several days. EXAM: MRI HEAD WITHOUT CONTRAST TECHNIQUE: Multiplanar, multiecho pulse sequences of the brain and surrounding structures were obtained without intravenous contrast. COMPARISON:  CT head and CTA head and neck earlier today. FINDINGS: Brain: Confluent restricted diffusion in the right pons extending to the midline in an area of about 12 millimeters (series 5, image 8). Associated T2 and FLAIR hyperintensity, and T1 hypointensity, with no hemorrhage or mass effect. Additional T2/FLAIR heterogeneity elsewhere in the pons, and also in the bilateral cerebellar peduncles (confluent on the right). But no other restricted diffusion. No midline shift, mass effect, evidence  of mass lesion, ventriculomegaly, extra-axial collection or acute intracranial hemorrhage. Cervicomedullary junction and pituitary are within normal limits. Supratentorial scattered mostly subcortical white matter T2 and FLAIR hyperintensity is mild to  moderate for age. No supratentorial cortical encephalomalacia. No chronic blood products. The deep gray nuclei remain normal. Vascular: Major intracranial vascular flow voids are preserved. Skull and upper cervical spine: Negative visible cervical spine. Visualized bone marrow signal is within normal limits. Sinuses/Orbits: Negative. Other: Mastoids are clear. Visible internal auditory structures appear normal. Negative scalp and face soft tissues. IMPRESSION: 1. Acute to subacute brainstem infarct in the right pons. No associated hemorrhage or mass effect. 2. Additional chronic appearing signal changes in the pons, and also confluent abnormal signal in the cerebellar peduncleswhich is felt due to developing Wallerian degeneration. 3. Comparatively mild supratentorial white matter signal changes. Electronically Signed   By: Odessa FlemingH  Hall M.D.   On: 11/27/2018 21:33    Scheduled Meds:  [START ON 11/29/2018] aspirin EC  81 mg Oral Daily   atorvastatin  40 mg Oral q1800   clopidogrel  75 mg Oral Daily   enoxaparin (LOVENOX) injection  40 mg Subcutaneous Q24H   insulin aspart  0-9 Units Subcutaneous TID WC   Continuous Infusions:   LOS: 0 days   Time spent: 31 minutes   Hughie Clossavi Makar Slatter, MD [facility]  11/28/2018, 3:26 PM   To contact the attending provider between 7A-7P or the covering provider during after hours 7P-7A, please log into the web site [email or web address] and use password [email or web address].

## 2018-11-28 NOTE — Evaluation (Signed)
Physical Therapy Evaluation Patient Details Name: Tiffany Schmitt MRN: [id number] DOB: Jun 02, 1960 Today's Date: 11/28/2018   History of Present Illness  58 y.o. female with medical history significant for type 2 diabetes and hypertension who presented to [place] ED for evaluation of left-sided weakness. She reports experiencing left-sided headache afterwards with some nausea, lightheadedness, dizziness without syncope.  Clinical Impression  Pt presents to PT with L weakness and chronic sensation deficits in BLE from neuropathy. Pt is able to perform tranfers, bed mobility, and stair training with use of RW and limited to no physical assistance. PT provides instruction on HEP and education on stroke recovery. Pt will benefit form continued acute PT POC to aide in a return to the pt's PLOF.    Follow Up Recommendations Home health PT    Equipment Recommendations  None recommended by PT    Recommendations for Other Services       Precautions / Restrictions Precautions Precautions: Fall Restrictions Weight Bearing Restrictions: No      Mobility  Bed Mobility                  Transfers Overall transfer level: Needs assistance Equipment used: Rolling walker (2 wheeled) Transfers: Sit to/from Stand Sit to Stand: Supervision            Ambulation/Gait Ambulation/Gait assistance: Min guard;Supervision(minG progressing to supervision) Gait Distance (Feet): 120 Feet Assistive device: Rolling walker (2 wheeled) Gait Pattern/deviations: Step-to pattern Gait velocity: reduced Gait velocity interpretation: 1.31 - 2.62 ft/sec, indicative of limited community ambulator General Gait Details: slowed step to gait  Stairs Stairs: Yes Stairs assistance: Min guard Stair Management: Two rails;Step to pattern;Forwards Number of Stairs: 2    Wheelchair Mobility    Modified Rankin (Stroke Patients Only) Modified Rankin (Stroke Patients Only) Pre-Morbid Rankin  Score: Slight disability Modified Rankin: Moderate disability     Balance Overall balance assessment: Needs assistance Sitting-balance support: No upper extremity supported Sitting balance-Leahy Scale: Good     Standing balance support: Bilateral upper extremity supported Standing balance-Leahy Scale: Good                               Pertinent Vitals/Pain Pain Assessment: Faces Faces Pain Scale: Hurts little more Pain Location: back Pain Descriptors / Indicators: Aching Pain Intervention(s): Limited activity within patient's tolerance    Home Living Family/patient expects to be discharged to:: Private residence Living Arrangements: Children Available Help at Discharge: Family;Available PRN/intermittently Type of Home: Mobile home Home Access: Stairs to enter Entrance Stairs-Rails: Can reach both Entrance Stairs-Number of Steps: 5 Home Layout: One level Home Equipment: Walker - 2 wheels;Shower seat      Prior Function Level of Independence: Independent with assistive device(s)         Comments: pt household ambulator with use of RW, does reports history of falls     Hand Dominance        Extremity/Trunk Assessment   Upper Extremity Assessment Upper Extremity Assessment: LUE deficits/detail;RUE deficits/detail RUE Sensation: decreased light touch LUE Deficits / Details: Grossly 4/5    Lower Extremity Assessment Lower Extremity Assessment: RLE deficits/detail;LLE deficits/detail RLE Sensation: decreased light touch LLE Deficits / Details: grossly 4/5 LLE Sensation: decreased light touch       Communication   Communication: No difficulties  Cognition Arousal/Alertness: Awake/alert Behavior During Therapy: WFL for tasks assessed/performed Overall Cognitive Status: Within Functional Limits for tasks assessed  General Comments      Exercises     Assessment/Plan    PT Assessment  Patient needs continued PT services  PT Problem List Decreased strength;Decreased activity tolerance;Decreased balance;Decreased mobility       PT Treatment Interventions DME instruction;Gait training;Stair training;Functional mobility training;Therapeutic activities;Therapeutic exercise;Balance training;Neuromuscular re-education;Patient/family education    PT Goals (Current goals can be found in the Care Plan section)  Acute Rehab PT Goals Patient Stated Goal: To return to prior level of function PT Goal Formulation: With patient Time For Goal Achievement: 12/12/18 Potential to Achieve Goals: Good    Frequency Min 4X/week   Barriers to discharge        Co-evaluation               AM-PAC PT "6 Clicks" Mobility  Outcome Measure Help needed turning from your back to your side while in a flat bed without using bedrails?: None Help needed moving from lying on your back to sitting on the side of a flat bed without using bedrails?: None Help needed moving to and from a bed to a chair (including a wheelchair)?: None Help needed standing up from a chair using your arms (e.g., wheelchair or bedside chair)?: None Help needed to walk in hospital room?: None Help needed climbing 3-5 steps with a railing? : A Little 6 Click Score: 23    End of Session Equipment Utilized During Treatment: Gait belt Activity Tolerance: Patient tolerated treatment well Patient left: in chair;with call bell/phone within reach Nurse Communication: Mobility status PT Visit Diagnosis: Muscle weakness (generalized) (M62.81)    Time: 1638-4536 PT Time Calculation (min) (ACUTE ONLY): 29 min   Charges:   PT Evaluation $PT Eval Low Complexity: 1 Low          Arlyss Gandy, PT, DPT Acute Rehabilitation Pager: [phone]   Arlyss Gandy 11/28/2018, 9:17 AM

## 2018-11-28 NOTE — TOC Initial Note (Signed)
Transition of Care [facility]) - Initial/Assessment Note    Patient Details  Name: Tiffany Schmitt MRN: [id number] Date of Birth: [birth year]  Transition of Care [facility]) CM/SW Contact:    Pollie Friar, RN Phone Number: 11/28/2018, 3:57 PM  Clinical Narrative:                 PT/OT recommending [place] services. CM has reached out to many of the [facility] agencies and they are refusing. CM spoke to PT and they feel patient could do outpatient therapy. MD notified and in agreement. CM spoke to patient and she prefers outpatient in [facility]. CM faxed her information to [facility] regional outpatient therapy.  Pt without a PCP. She was interested in having one in the [place] area. CM was able to obtain her an appointment and placed the information on the AVS. TOC following for further d/c needs.  Expected Discharge Plan: [place] Barriers to Discharge: Continued Medical Work up   Patient Goals and CMS Choice   CMS Medicare.gov Compare Post Acute Care list provided to:: Patient Choice offered to / list presented to : Patient  Expected Discharge Plan and Services Expected Discharge Plan: [place]   Discharge Planning Services: CM Consult                                          Prior Living Arrangements/Services   Lives with:: Adult Children Patient language and need for interpreter reviewed:: Yes Do you feel safe going back to the place where you live?: Yes            Criminal Activity/Legal Involvement Pertinent to Current Situation/Hospitalization: No - Comment as needed  Activities of Daily Living Home Assistive Devices/Equipment: None ADL Screening (condition at time of admission) Patient's cognitive ability adequate to safely complete daily activities?: Yes Is the patient deaf or have difficulty hearing?: No Does the patient have difficulty seeing, even when wearing glasses/contacts?: No Does the patient have difficulty concentrating,  remembering, or making decisions?: No Patient able to express need for assistance with ADLs?: Yes Does the patient have difficulty dressing or bathing?: No Independently performs ADLs?: Yes (appropriate for developmental age) Does the patient have difficulty walking or climbing stairs?: No Weakness of Legs: Left Weakness of Arms/Hands: Left  Permission Sought/Granted                  Emotional Assessment Appearance:: Appears stated age Attitude/Demeanor/Rapport: Engaged Affect (typically observed): Accepting, Pleasant Orientation: : Oriented to Self, Oriented to Place, Oriented to  Time, Oriented to Situation   Psych Involvement: No (comment)  Admission diagnosis:  LEFT SIDE WEAKNESS, UNSTABLE Patient Active Problem List   Diagnosis Date Noted  . Acute ischemic vertebrobasilar artery brainstem stroke, right ([place]) 11/28/2018  . Acute ischemic stroke ([place]) 11/28/2018  . Left-sided weakness 11/27/2018  . Diabetes mellitus without complication ([place])   . Hypertension associated with diabetes [facility])    PCP:  Patient, No Pcp Per Pharmacy:   [facility], [place] [address] Phone: [phone] Fax: [phone]     Social Determinants of Health (SDOH) Interventions    Readmission Risk Interventions No flowsheet data found.

## 2018-11-28 NOTE — Progress Notes (Signed)
SLP Cancellation Note  Patient Details Name: Tiffany Schmitt MRN: [id number] DOB: [birth year]   Cancelled treatment:        Attempted to see pt for cognitive-linguistic evaluation.  Pt working with OT at time of SLP attempt.  Will reattempt as schedule permits.   [place] 11/28/2018, 9:54 AM

## 2018-11-28 NOTE — Progress Notes (Signed)
Arrived from ED at 2300.  Alert and oriented. C/o of chronic back pain 5/10. Given Tylenol and pt able to reposition. Ambulatory to BR with 1 assist, a little unsteady. Call light within reach.

## 2018-11-29 DIAGNOSIS — I10 Essential (primary) hypertension: Secondary | ICD-10-CM | POA: Diagnosis not present

## 2018-11-29 DIAGNOSIS — R531 Weakness: Secondary | ICD-10-CM | POA: Diagnosis not present

## 2018-11-29 LAB — BASIC METABOLIC PANEL
Anion gap: 10 (ref 5–15)
BUN: 15 mg/dL (ref 6–20)
CO2: 23 mmol/L (ref 22–32)
Calcium: 9.2 mg/dL (ref 8.9–10.3)
Chloride: 103 mmol/L (ref 98–111)
Creatinine, Ser: 0.82 mg/dL (ref 0.44–1.00)
GFR calc Af Amer: >60 mL/min (ref >60)
GFR calc non Af Amer: >60 mL/min (ref >60)
Glucose, Bld: 271 mg/dL — ABNORMAL HIGH (ref 70–99)
Potassium: 3.9 mmol/L (ref 3.5–5.1)
Sodium: 136 mmol/L (ref 135–145)

## 2018-11-29 LAB — CBC WITH DIFFERENTIAL/PLATELET
Abs Immature Granulocytes: 0.02 10*3/uL (ref 0.00–0.07)
Basophils Absolute: 0 10*3/uL (ref 0.0–0.1)
Basophils Relative: 0 %
Eosinophils Absolute: 0.2 10*3/uL (ref 0.0–0.5)
Eosinophils Relative: 3 %
HCT: 36.7 % (ref 36.0–46.0)
Hemoglobin: 12 g/dL (ref 12.0–15.0)
Immature Granulocytes: 0 %
Lymphocytes Relative: 26 %
Lymphs Abs: 1.7 10*3/uL (ref 0.7–4.0)
MCH: 27.1 pg (ref 26.0–34.0)
MCHC: 32.7 g/dL (ref 30.0–36.0)
MCV: 83 fL (ref 80.0–100.0)
Monocytes Absolute: 0.6 10*3/uL (ref 0.1–1.0)
Monocytes Relative: 9 %
Neutro Abs: 4.1 10*3/uL (ref 1.7–7.7)
Neutrophils Relative %: 62 %
Platelets: 229 10*3/uL (ref 150–400)
RBC: 4.42 MIL/uL (ref 3.87–5.11)
RDW: 13.7 % (ref 11.5–15.5)
WBC: 6.7 10*3/uL (ref 4.0–10.5)
nRBC: 0 % (ref 0.0–0.2)

## 2018-11-29 LAB — GLUCOSE, CAPILLARY
Glucose-Capillary: 264 mg/dL — ABNORMAL HIGH (ref 70–99)
Glucose-Capillary: 251 mg/dL — ABNORMAL HIGH (ref 70–99)

## 2018-11-29 MED ORDER — ATORVASTATIN CALCIUM 40 MG PO TABS: 40.0000 mg | ORAL_TABLET | Freq: Every day | ORAL | 0 refills | Status: DC

## 2018-11-29 MED ORDER — INSULIN PEN NEEDLE 32G X 4 MM MISC: 0 refills | Status: DC

## 2018-11-29 MED ORDER — CLOPIDOGREL BISULFATE 75 MG PO TABS: 75.0000 mg | ORAL_TABLET | Freq: Every day | ORAL | 0 refills | Status: AC

## 2018-11-29 MED ORDER — INSULIN GLARGINE 100 UNIT/ML SOLOSTAR PEN: 15.0000 [IU] | PEN_INJECTOR | Freq: Every day | SUBCUTANEOUS | 0 refills | Status: DC

## 2018-11-29 MED ORDER — INSULIN LISPRO 100 UNIT/ML ~~LOC~~ SOLN: 5.0000 [IU] | Freq: Two times a day (BID) | SUBCUTANEOUS | 11 refills | Status: DC

## 2018-11-29 MED ORDER — ASPIRIN 81 MG PO TBEC: 81.0000 mg | DELAYED_RELEASE_TABLET | Freq: Every day | ORAL | 5 refills | Status: DC

## 2018-11-29 NOTE — Progress Notes (Signed)
Inpatient Diabetes Program Recommendations  AACE/ADA: New Consensus Statement on Inpatient Glycemic Control (2015)  Target Ranges:  Prepandial:   less than 140 mg/dL      Peak postprandial:   less than 180 mg/dL (1-2 hours)      Critically ill patients:  140 - 180 mg/dL   Lab Results  Component Value Date   GLUCAP 251 (H) 11/29/2018   HGBA1C 11.3 (H) 11/28/2018    Review of Glycemic Control Results for MARIONA, SCHOLES (MRN [id number]) as of 11/29/2018 13:19  Ref. Range 11/28/2018 17:15 11/28/2018 21:29 11/29/2018 06:15 11/29/2018 12:23  Glucose-Capillary Latest Ref Range: 70 - 99 mg/dL 351 (H) 171 (H) 264 (H) 251 (H)   Diabetes history: Type 2 DM Outpatient Diabetes medications: metformin 8022 mg BID, Trulicity 3.36 Q Saturday Current orders for Inpatient glycemic control: Lantus 15 units QD, Novolog 0-9 units TID  Inpatient Diabetes Program Recommendations:    Spoke with son and patient at bedside regarding outpatient DM management. Patient is followed by Dr Dwyane Dee, endocrinology. Patient expresses frustration and issues with glycemic control including hypoglycemia using V-Go 30 and attempts with plant-based diets.  Reviewed patient's current A1c of 11.3%. Explained what a A1c is and what it measures. Also reviewed goal A1c with patient, importance of good glucose control @ home, and blood sugar goals. Reviewed in detail patho of DM, need for insulin, role of pancreas, survival skills, interventions, hypo vs hyper glycemia, counting carbohydrates, long acting vs short acting insulin, differences with V-Go, importance of checking CBGs to determine appropriate dosing, vascular changes and commorbidities.  Patient reports she was doing well with the plant-based diet, but then starting working third shift and "kept cheating". Reports inconsistent meals and inappropriate portion sizes. Reviewed carb counting and goal setting. Patient has appointment with endo in November.   [employer] has a  meter and testing supplies. Recommended increasing frequency to 3 times per day. Patient asking about [employer]. Reviewed Freestyle Libre benefits, cost, risks and when to report readings to endo.  Discussed application with Dr Doristine Bosworth, orders received for placement. Applied sensor to patient. Reviewed frequency for checking CBGs, when to call MD and how to get prescriptions for additional sensors.  Educated patient and son on insulin pen use at home. Patient has used prior, however, it has been "awhile" and son requested additional information. Reviewed contents of insulin flexpen starter kit. Reviewed all steps if insulin pen including attachment of needle, 2-unit air shot, dialing up dose, giving injection, removing needle, disposal of sharps, storage of unused insulin, disposal of insulin etc. Patient able to provide successful return demonstration. Also reviewed troubleshooting with insulin pen. MD to give patient Rxs for insulin pens and insulin pen needles.  Thanks, Bronson Curb, MSN, RNC-OB Diabetes Coordinator [phone] (8a-5p)

## 2018-11-29 NOTE — Progress Notes (Signed)
Occupational Therapy Treatment Patient Details Name: Tiffany Schmitt MRN: [id number] DOB: November 01, 1960 Today's Date: 11/29/2018    History of present illness 58 y.o. female with medical history significant for type 2 diabetes and hypertension who presented to [facility] ED for evaluation of left-sided weakness. She reports experiencing left-sided headache afterwards with some nausea, lightheadedness, dizziness without syncope.   OT comments  Pt progressing with mobility with RW and standing at sink for grooming tasks x3 mins. Pt given [facility] handout for performing LUE HEP with theraputty and AROM with handout provided . Pt continues to have weakness and poor to fair [facility] in L hand. Pt would benefit from continued OT skilled services. OT following acutely.    Follow Up Recommendations  Outpatient OT    Equipment Recommendations  None recommended by OT    Recommendations for Other Services      Precautions / Restrictions Precautions Precautions: Fall Restrictions Weight Bearing Restrictions: No       Mobility Bed Mobility Overal bed mobility: Modified Independent             General bed mobility comments: HOB elevated  Transfers Overall transfer level: Modified independent Equipment used: Rolling walker (2 wheeled) Transfers: Sit to/from Stand Sit to Stand: Modified independent (Device/Increase time)              Balance Overall balance assessment: Modified Independent Sitting-balance support: No upper extremity supported;Feet supported Sitting balance-Leahy Scale: Good     Standing balance support: Bilateral upper extremity supported Standing balance-Leahy Scale: Fair                             ADL either performed or assessed with clinical judgement   ADL Overall ADL's : Needs assistance/impaired Eating/Feeding: Modified independent;Sitting   Grooming: Supervision/safety;Standing Grooming Details (indicate cue type and reason):  standing at sink for light grooming using LUE with decreased coordination                 Toilet Transfer: Supervision/safety;Ambulation;RW   Toileting- Clothing Manipulation and Hygiene: Supervision/safety;Sitting/lateral lean;Sit to/from stand;Cueing for safety       Functional mobility during ADLs: Supervision/safety;Rolling walker General ADL Comments: Pt continues to have weakness and poor to fair [facility] in L hand.     Vision   Vision Assessment?: No apparent visual deficits   Perception     Praxis      Cognition Arousal/Alertness: Awake/alert Behavior During Therapy: WFL for tasks assessed/performed Overall Cognitive Status: Within Functional Limits for tasks assessed                                          Exercises     Shoulder Instructions       General Comments      Pertinent Vitals/ Pain       Pain Assessment: No/denies pain  Home Living                                          Prior Functioning/Environment              Frequency  Min 2X/week        Progress Toward Goals  OT Goals(current goals can now be found in the care plan section)  Progress towards OT goals: Progressing toward goals  Acute Rehab OT Goals Patient Stated Goal: To return to prior level of function OT Goal Formulation: With patient Time For Goal Achievement: 12/12/18 Potential to Achieve Goals: Good ADL Goals Pt Will Perform Lower Body Dressing: with modified independence;sit to/from stand Pt/caregiver will Perform Home Exercise Program: Left upper extremity;With theraputty;Independently;With written HEP provided Additional ADL Goal #1: Pt will increase to use of LUE with ADL to 90% accuracy with fine motor coordination in 3/3 trials.  Plan Discharge plan remains appropriate    Co-evaluation                 AM-PAC OT "6 Clicks" Daily Activity     Outcome Measure   Help from another person eating meals?: None Help  from another person taking care of personal grooming?: None Help from another person toileting, which includes using toliet, bedpan, or urinal?: A Little Help from another person bathing (including washing, rinsing, drying)?: A Little Help from another person to put on and taking off regular upper body clothing?: None Help from another person to put on and taking off regular lower body clothing?: A Little 6 Click Score: 21    End of Session Equipment Utilized During Treatment: Gait belt;Rolling walker  OT Visit Diagnosis: Unsteadiness on feet (R26.81);Muscle weakness (generalized) (M62.81)   Activity Tolerance Patient tolerated treatment well   Patient Left in chair;with call bell/phone within reach;with chair alarm set   Nurse Communication Mobility status        Time: 0370-4888 OT Time Calculation (min): 24 min  Charges: OT General Charges $OT Visit: 1 Visit OT Treatments $Self Care/Home Management : 8-22 mins $Therapeutic Exercise: 8-22 mins  Cristi Loron) Glendell Docker OTR/L Acute Rehabilitation Services Pager: [phone] Office: [phone]    Lonzo Cloud 11/29/2018, 2:31 PM

## 2018-11-29 NOTE — Progress Notes (Signed)
Physical Therapy Treatment Patient Details Name: Tiffany Schmitt MRN: [id number] DOB: [birth year] Today's Date: 11/29/2018    History of Present Illness 58 y.o. female with medical history significant for type 2 diabetes and hypertension who presented to [facility] ED for evaluation of left-sided weakness. She reports experiencing left-sided headache afterwards with some nausea, lightheadedness, dizziness without syncope.    PT Comments    Pt tolerated treatment well, demonstrating improved strength in R side, ambulation tolerance, and stair negotiation tolerance. Pt demonstrates overall improvement in dynamic balance, and is modI for household mobility other than stair negotiation. Pt will continue to benefit from acute PT POC to improve LE power and strength and restore independence in stair negotiation.   Follow Up Recommendations  Outpatient PT     Equipment Recommendations  None recommended by PT    Recommendations for Other Services       Precautions / Restrictions Precautions Precautions: Fall Restrictions Weight Bearing Restrictions: No    Mobility  Bed Mobility Overal bed mobility: Modified Independent             General bed mobility comments: HOB elevated  Transfers Overall transfer level: Modified independent Equipment used: Rolling walker (2 wheeled) Transfers: Sit to/from Stand Sit to Stand: Modified independent (Device/Increase time)            Ambulation/Gait Ambulation/Gait assistance: Modified independent (Device/Increase time) Gait Distance (Feet): 300 Feet Assistive device: Rolling walker (2 wheeled) Gait Pattern/deviations: Step-through pattern Gait velocity: reduced Gait velocity interpretation: 1.31 - 2.62 ft/sec, indicative of limited community ambulator General Gait Details: reduced gait speed and stride length, otherwise unremarkable   Stairs Stairs: Yes Stairs assistance: Min guard Stair Management: One rail  Right;Forwards Number of Stairs: 10 General stair comments: one rail and one hand hold, step to pattern   Wheelchair Mobility    Modified Rankin (Stroke Patients Only) Modified Rankin (Stroke Patients Only) Pre-Morbid Rankin Score: Slight disability Modified Rankin: Moderate disability     Balance Overall balance assessment: Modified Independent Sitting-balance support: No upper extremity supported;Feet supported Sitting balance-Leahy Scale: Good     Standing balance support: No upper extremity supported Standing balance-Leahy Scale: Fair                              Cognition Arousal/Alertness: Awake/alert Behavior During Therapy: WFL for tasks assessed/performed Overall Cognitive Status: Within Functional Limits for tasks assessed                                        Exercises      General Comments        Pertinent Vitals/Pain Pain Assessment: No/denies pain    Home Living                      Prior Function            PT Goals (current goals can now be found in the care plan section) Acute Rehab PT Goals Patient Stated Goal: To return to prior level of function Progress towards PT goals: Progressing toward goals    Frequency    Min 4X/week      PT Plan Current plan remains appropriate    Co-evaluation              AM-PAC PT "6 Clicks" Mobility   Outcome Measure  Help needed turning from your back to your side while in a flat bed without using bedrails?: None Help needed moving from lying on your back to sitting on the side of a flat bed without using bedrails?: None Help needed moving to and from a bed to a chair (including a wheelchair)?: None Help needed standing up from a chair using your arms (e.g., wheelchair or bedside chair)?: None Help needed to walk in hospital room?: None Help needed climbing 3-5 steps with a railing? : A Little 6 Click Score: 23    End of Session Equipment Utilized  During Treatment: (none) Activity Tolerance: Patient tolerated treatment well Patient left: in bed;with call bell/phone within reach;with bed alarm set Nurse Communication: Mobility status PT Visit Diagnosis: Muscle weakness (generalized) (M62.81)     Time: 5329-9242 PT Time Calculation (min) (ACUTE ONLY): 30 min  Charges:  $Gait Training: 8-22 mins $Therapeutic Activity: 8-22 mins                     Arlyss Gandy, PT, DPT Acute Rehabilitation Pager: [phone]  Arlyss Gandy 11/29/2018, 8:35 AM

## 2018-11-29 NOTE — Discharge Summary (Addendum)
Physician Discharge Summary  [facility] MWN:[id number] DOB: 1960/04/01 DOA: 11/27/2018  PCP: Patient, No Pcp Per  Admit date: 11/27/2018 Discharge date: 11/29/2018  Admitted From: Home Disposition: Home  Recommendations for Outpatient Follow-up:  1. Follow up with PCP in 1-2 weeks 2. Follow with outpatient OT PT 3. Follow-up with your endocrinologist as soon as possible, preferably next week to discuss options for better control of your type 2 diabetes mellitus 4. Please obtain BMP/CBC in one week 5. Please follow up on the following pending results:  Home Health: None Equipment/Devices: None  Discharge Condition: Stable CODE STATUS: Full code Diet recommendation: Cardiac/consistent carbohydrate/diabetic  Subjective: Patient seen and examined.  No new complaint.  Weakness on the left side is improving.  Comfortable going home today.  Brief/Interim Summary: Tiffany Hollifieldis a 58 y.o.femalewith medical history significant fortype 2 diabetes and hypertension who presented [facility] ED for evaluation of left-sided weakness.Patient states she was in her usual state of health until 11/23/2018.  She reports experiencing left-sided headache afterwards with some nausea, lightheadedness, dizziness without syncope and occasional palpitations without chest pain or dyspnea.  No other complaint.  Upon arrival to ED, she was hemodynamically stable.  UA and UDS were negative.  CovID test negative. CT head without contrast was negative for acute infarction or intracranial hemorrhage.  CTA head/neck showed intracranial atherosclerotic disease with multifocal stenoses with moderate stenosis within the paraclinoid right ICA, moderate stenosis within cavernous left ICA, moderate stenosis within the intracranial right vertebral artery, focal moderate stenosis within the mid to distal basilar artery, and focal moderate/severe stenosis within the P2 left posterior cerebral artery.  The common and internal carotid arteries are patent within the neck bilaterally, radiology report suspects greater than 50% stenosis at the origin of the left ICA. Bilateral vertebral arteries were patent within the neck.  Neurology were consulted and recommended ED to ED transfer for stat MRI brain and medical admission.  Patient was then admitted at [facility] under hospitalist service with neurology consultation.  MRI showed acute to subacute brainstem infarct in the right pons with no hemorrhage or mass-effect.  Additional chronic appearing signal change in the pons and also confluent abnormal signal in the cerebellar peduncles.  Patient was started on aspirin and Plavix as well as atorvastatin 40 mg p.o. daily.  She was assessed by PT OT and they recommended outpatient OT PT.  Neurology cleared the patient with recommendations to continue aspirin and Plavix combination for 3 weeks and then aspirin alone.  I had very long discussion yesterday and today with the patient about how her uncontrolled diabetes has played a significant role in her stroke.  She told me that she sees an endocrinologist at [facility] who is managing her diabetes.  She is only on Trulicity and metformin however her hemoglobin A1c was over 11 here and over 11 again 4 to 5 months ago according to patient.  We controlled her blood sugar here with long-acting insulin and SSI however it remained slightly elevated.  She takes NovoLog 5 units in the morning before breakfast and 10 units at night before dinner.  At this point in time, I discussed starting her on other medication such as glipizide, glimepiride and Januvia however patient stated that she has been tried on several different medications which she does not remember the names of and she has had some allergy reactions to couple of them.  At this point in time, since patient does not have enough information about her previous  allergies and trial of medications, I was not  comfortable starting her on any oral medications however per our diabetes mellitus coordinator recommendations, I am discharging her on 15 units of Lantus.  However, I strongly advised her to call her endocrinologist and see her as soon as possible next week for further adjustment of her diabetes.  We held her lisinopril to allow permissive hypertension due to acute ischemic stroke.  Her symptoms started last Saturday Sunday, it has been 5 days.  Assuming the stroke happened 5 days ago, now her blood pressure can be controlled.  I have asked her to hold her lisinopril 1 more day, today and then resume tomorrow.  She does not check her blood pressure at home so she is not sure how her numbers run however when she goes to the doctors, she has been told that her numbers are within normal limits while being only on low-dose of lisinopril 5 mg.  That limits my ability to make any adjustments on her hypertension medications also.  I strongly recommended her to buy her own machine and check her blood pressure twice a day and bring those numbers to her PCP next visit which will help her PCP to make further decisions on her antihypertensives.  She verbalized understanding all of that.  She is agreeable with discharge today.  I also advised her that she is supposed to take aspirin and Plavix combination for only 3 weeks and then stop Plavix but continue aspirin.  Discharge Diagnoses:  Principal Problem:   Left-sided weakness Active Problems:   Diabetes mellitus without complication (HCC)   Hypertension associated with diabetes (HCC)   Acute ischemic vertebrobasilar artery brainstem stroke, right (HCC)   Acute ischemic stroke [facility])    Discharge Instructions  Discharge Instructions    Ambulatory referral to Neurology   Complete by: As directed    Follow up in stroke clinic at [facility] with Ihor Austin, NP in about 4 weeks. If not available, consider Dr. Delia Heady, Dr. Jamelle Rushing, or Dr. Naomie Dean.   Ambulatory referral to Occupational Therapy   Complete by: As directed    Ambulatory referral to Physical Therapy   Complete by: As directed    Discharge patient   Complete by: As directed    Discharge disposition: 01-Home or Self Care   Discharge patient date: 11/29/2018     Allergies as of 11/29/2018      Reactions   Cephalexin Itching   Gabapentin Itching   Atorvastatin Other (See Comments)   Gemfibrozil Rash   Pioglitazone Rash   Rosuvastatin Other (See Comments)      Medication List    TAKE these medications   aspirin 81 MG EC tablet Take 1 tablet (81 mg total) by mouth daily. Start taking on: November 30, 2018   atorvastatin 40 MG tablet Commonly known as: LIPITOR Take 1 tablet (40 mg total) by mouth daily at 6 PM.   clopidogrel 75 MG tablet Commonly known as: PLAVIX Take 1 tablet (75 mg total) by mouth daily for 20 days. Start taking on: November 30, 2018   Insulin Glargine 100 UNIT/ML Solostar Pen Commonly known as: LANTUS Inject 15 Units into the skin at bedtime.   insulin lispro 100 UNIT/ML injection Commonly known as: HUMALOG Inject 0.05-0.1 mLs (5-10 Units total) into the skin 2 (two) times daily. Sliding Scale   Insulin Pen Needle 32G X 4 MM Misc n   lisinopril-hydrochlorothiazide 10-12.5 MG tablet Commonly known as: ZESTORETIC Take  1 tablet by mouth daily. Notes to patient: Take 11/29/2018   metFORMIN 1000 MG tablet Commonly known as: GLUCOPHAGE Take 1,000 mg by mouth 2 (two) times daily.   omeprazole 20 MG capsule Commonly known as: PRILOSEC Take 20 mg by mouth daily.   Trulicity 0.75 MG/0.5ML Sopn Generic drug: Dulaglutide Inject 0.75 mg into the skin every Saturday.      Follow-up Information    [facility] Follow up in 4 week(s).   Specialty: Neurology Why: stroke clinic. office will call with appt date and time.  Contact information: [address] [place] [address] [phone]       Dr Mayford Knife Follow up on 12/02/2018.   Why: Your appointment is at 9:30 am. Please arrive early and bring your insurance card, picture ID and your current medications. Contact information: [address]  [place], [place] [address]  [phone]         Allergies  Allergen Reactions  . Cephalexin Itching  . Gabapentin Itching  . Atorvastatin Other (See Comments)  . Gemfibrozil Rash  . Pioglitazone Rash  . Rosuvastatin Other (See Comments)    Consultations: Neurology   Procedures/Studies: Ct Angio Head W Or Schmitt Contrast  Result Date: 11/27/2018 CLINICAL DATA:  Focal neuro deficit, greater than 6 hours, stroke suspected. Additional history provided: Patient presents with left-sided weakness that began Saturday unknown time. Reports headache on Friday and not feeling well throughout the weekend, feeling off balance and heaviness of left side. EXAM: CT ANGIOGRAPHY HEAD AND NECK TECHNIQUE: Multidetector CT imaging of the head and neck was performed using the standard protocol during bolus administration of intravenous contrast. Multiplanar CT image reconstructions and MIPs were obtained to evaluate the vascular anatomy. Carotid stenosis measurements (when applicable) are obtained utilizing NASCET criteria, using the distal internal carotid diameter as the denominator. CONTRAST:  75mL OMNIPAQUE IOHEXOL 350 MG/ML SOLN COMPARISON:  Noncontrast head CT performed earlier the same day 11/27/2018. FINDINGS: CT HEAD FINDINGS No evidence of acute intracranial hemorrhage. No demarcated cortical infarction. No evidence of intracranial mass. No midline shift or extra-axial fluid collection. Cerebral volume is normal for age. Vascular: Reported separately Skull: Normal. Negative for fracture or focal lesion. Sinuses: No significant paranasal sinus disease or mastoid effusion. Orbits: Visualized orbits demonstrate no acute abnormality. Review of the MIP images  confirms the above findings CTA NECK FINDINGS Aortic arch: Common origin of the innominate and left common carotid arteries. Included portions of the aortic arch demonstrate no evidence of dissection or aneurysm. Atherosclerotic plaque within the visualized aortic arch and proximal major branch vessels of the neck. Right carotid system: Common and internal carotid arteries patent within the neck without stenosis. Minimal soft and calcified plaque within the proximal ICA. Left carotid system: Common carotid artery patent without stenosis. There is significant plaque within the carotid bifurcation and proximal ICA. It is difficult to accurately measure the degree of stenosis at the origin of the left ICA due to irregularity of soft and calcified plaque at this site. There may be greater than 50% stenosis at the origin of the left ICA. Distal to this the ICA is patent within the neck without significant stenosis. Vertebral arteries: Right vertebral artery dominant. The vertebral arteries are patent within the neck bilaterally without significant stenosis. Skeleton: No acute bony abnormality. Cervical spondylosis greatest at C5-C6 and C6-C7 where there is moderate disc degeneration small posterior disc osteophytes. Other neck: No soft tissue neck mass or pathologically enlarged cervical chain lymph  nodes. Thyroid negative. Upper chest: Patchy ground-glass opacity/mosaic attenuation within the imaged lung apices may reflect mild edema or sequela of small airways disease. Review of the MIP images confirms the above findings CTA HEAD FINDINGS Anterior circulation: Soft and calcified plaque within the cavernous and paraclinoid right internal carotid artery. Sites of mild to moderate stenosis within the cavernous right ICA. Moderate focal stenosis within the paraclinoid right ICA (series 11, image 247). Soft and calcified plaque within the cavernous and paraclinoid left internal carotid artery. Sites of at least moderate  stenosis within the cavernous left ICA. The right middle and anterior cerebral arteries are patent without significant proximal stenosis. The left middle and anterior cerebral arteries are patent without significant proximal stenosis. No intracranial aneurysm is identified. Posterior circulation: Significant soft and calcified plaque within the intracranial right vertebral artery with sites of up to moderate stenosis. The left vertebral artery is patent without significant stenosis. Atherosclerotic irregularity of the basilar artery with noncalcified plaque. Focal moderate stenosis within the mid to distal basilar artery. Significant atherosclerotic irregularity of the bilateral posterior cerebral arteries. Mild-to-moderate focal stenosis within the right PCA at the P1-P2 junction. Moderate/severe focal stenosis within the P2 left posterior cerebral artery. Venous sinuses: Within limitations of contrast timing, no convincing thrombus. Anatomic variants: Posterior cerebral arteries are poorly delineated bilaterally and may be hypoplastic or absent. Review of the MIP images confirms the above findings IMPRESSION: CT head: No CT evidence of acute intracranial abnormality. Consider brain MRI for further evaluation, as clinically warranted. CTA head: 1. Intracranial atherosclerotic disease with multifocal stenoses, most notably as follows. 2. Moderate stenosis within the paraclinoid right internal carotid artery. 3. Sites of at least moderate stenosis within the cavernous left internal carotid artery. 4. Sites of up to moderate stenosis within the intracranial right vertebral artery. 5. Focal moderate stenosis within the mid to distal basilar artery. 6. Focal moderate/severe stenosis within the P2 left posterior cerebral artery. CTA neck: 1. Common and internal carotid arteries patent within the neck bilaterally. It is difficult to accurately quantify the degree of stenosis at the origin of the left ICA due to  irregularity of prominent soft and calcified plaque at this site. Greater than 50% stenosis may be present. Carotid artery duplex recommended for further evaluation. 2. Bilateral vertebral arteries patent within the neck without stenosis. 3. Patchy ground-glass opacity/mosaic attenuation within the imaged lung apices, which may reflect edema or sequela of small airways disease. Electronically Signed   By: Jackey Loge   On: 11/27/2018 18:56   Ct Head Schmitt Contrast  Result Date: 11/27/2018 CLINICAL DATA:  Left-sided weakness that began Saturday EXAM: CT HEAD WITHOUT CONTRAST TECHNIQUE: Contiguous axial images were obtained from the base of the skull through the vertex without intravenous contrast. COMPARISON:  None. FINDINGS: Brain: No evidence of acute infarction, hemorrhage, hydrocephalus, extra-axial collection or mass lesion/mass effect. Vascular: There are vascular calcifications in the carotid siphons. Skull: Normal. Negative for fracture or focal lesion. Sinuses/Orbits: No acute finding. Other: None. IMPRESSION: No acute intracranial process. Electronically Signed   By: Romona Curls M.D.   On: 11/27/2018 14:04   Ct Angio Neck W And/or Schmitt Contrast  Result Date: 11/27/2018 CLINICAL DATA:  Focal neuro deficit, greater than 6 hours, stroke suspected. Additional history provided: Patient presents with left-sided weakness that began Saturday unknown time. Reports headache on Friday and not feeling well throughout the weekend, feeling off balance and heaviness of left side. EXAM: CT ANGIOGRAPHY HEAD AND NECK TECHNIQUE: Multidetector CT  imaging of the head and neck was performed using the standard protocol during bolus administration of intravenous contrast. Multiplanar CT image reconstructions and MIPs were obtained to evaluate the vascular anatomy. Carotid stenosis measurements (when applicable) are obtained utilizing NASCET criteria, using the distal internal carotid diameter as the denominator. CONTRAST:   75mL OMNIPAQUE IOHEXOL 350 MG/ML SOLN COMPARISON:  Noncontrast head CT performed earlier the same day 11/27/2018. FINDINGS: CT HEAD FINDINGS No evidence of acute intracranial hemorrhage. No demarcated cortical infarction. No evidence of intracranial mass. No midline shift or extra-axial fluid collection. Cerebral volume is normal for age. Vascular: Reported separately Skull: Normal. Negative for fracture or focal lesion. Sinuses: No significant paranasal sinus disease or mastoid effusion. Orbits: Visualized orbits demonstrate no acute abnormality. Review of the MIP images confirms the above findings CTA NECK FINDINGS Aortic arch: Common origin of the innominate and left common carotid arteries. Included portions of the aortic arch demonstrate no evidence of dissection or aneurysm. Atherosclerotic plaque within the visualized aortic arch and proximal major branch vessels of the neck. Right carotid system: Common and internal carotid arteries patent within the neck without stenosis. Minimal soft and calcified plaque within the proximal ICA. Left carotid system: Common carotid artery patent without stenosis. There is significant plaque within the carotid bifurcation and proximal ICA. It is difficult to accurately measure the degree of stenosis at the origin of the left ICA due to irregularity of soft and calcified plaque at this site. There may be greater than 50% stenosis at the origin of the left ICA. Distal to this the ICA is patent within the neck without significant stenosis. Vertebral arteries: Right vertebral artery dominant. The vertebral arteries are patent within the neck bilaterally without significant stenosis. Skeleton: No acute bony abnormality. Cervical spondylosis greatest at C5-C6 and C6-C7 where there is moderate disc degeneration small posterior disc osteophytes. Other neck: No soft tissue neck mass or pathologically enlarged cervical chain lymph nodes. Thyroid negative. Upper chest: Patchy  ground-glass opacity/mosaic attenuation within the imaged lung apices may reflect mild edema or sequela of small airways disease. Review of the MIP images confirms the above findings CTA HEAD FINDINGS Anterior circulation: Soft and calcified plaque within the cavernous and paraclinoid right internal carotid artery. Sites of mild to moderate stenosis within the cavernous right ICA. Moderate focal stenosis within the paraclinoid right ICA (series 11, image 247). Soft and calcified plaque within the cavernous and paraclinoid left internal carotid artery. Sites of at least moderate stenosis within the cavernous left ICA. The right middle and anterior cerebral arteries are patent without significant proximal stenosis. The left middle and anterior cerebral arteries are patent without significant proximal stenosis. No intracranial aneurysm is identified. Posterior circulation: Significant soft and calcified plaque within the intracranial right vertebral artery with sites of up to moderate stenosis. The left vertebral artery is patent without significant stenosis. Atherosclerotic irregularity of the basilar artery with noncalcified plaque. Focal moderate stenosis within the mid to distal basilar artery. Significant atherosclerotic irregularity of the bilateral posterior cerebral arteries. Mild-to-moderate focal stenosis within the right PCA at the P1-P2 junction. Moderate/severe focal stenosis within the P2 left posterior cerebral artery. Venous sinuses: Within limitations of contrast timing, no convincing thrombus. Anatomic variants: Posterior cerebral arteries are poorly delineated bilaterally and may be hypoplastic or absent. Review of the MIP images confirms the above findings IMPRESSION: CT head: No CT evidence of acute intracranial abnormality. Consider brain MRI for further evaluation, as clinically warranted. CTA head: 1. Intracranial atherosclerotic disease with  multifocal stenoses, most notably as follows. 2.  Moderate stenosis within the paraclinoid right internal carotid artery. 3. Sites of at least moderate stenosis within the cavernous left internal carotid artery. 4. Sites of up to moderate stenosis within the intracranial right vertebral artery. 5. Focal moderate stenosis within the mid to distal basilar artery. 6. Focal moderate/severe stenosis within the P2 left posterior cerebral artery. CTA neck: 1. Common and internal carotid arteries patent within the neck bilaterally. It is difficult to accurately quantify the degree of stenosis at the origin of the left ICA due to irregularity of prominent soft and calcified plaque at this site. Greater than 50% stenosis may be present. Carotid artery duplex recommended for further evaluation. 2. Bilateral vertebral arteries patent within the neck without stenosis. 3. Patchy ground-glass opacity/mosaic attenuation within the imaged lung apices, which may reflect edema or sequela of small airways disease. Electronically Signed   By: Jackey LogeKyle  Golden   On: 11/27/2018 18:56   Tiffany Schmitt Contrast  Result Date: 11/27/2018 CLINICAL DATA:  58 year old female with left side weakness for several days. EXAM: MRI HEAD WITHOUT CONTRAST TECHNIQUE: Multiplanar, multiecho pulse sequences of the brain and surrounding structures were obtained without intravenous contrast. COMPARISON:  CT head and CTA head and neck earlier today. FINDINGS: Brain: Confluent restricted diffusion in the right pons extending to the midline in an area of about 12 millimeters (series 5, image 8). Associated T2 and FLAIR hyperintensity, and T1 hypointensity, with no hemorrhage or mass effect. Additional T2/FLAIR heterogeneity elsewhere in the pons, and also in the bilateral cerebellar peduncles (confluent on the right). But no other restricted diffusion. No midline shift, mass effect, evidence of mass lesion, ventriculomegaly, extra-axial collection or acute intracranial hemorrhage. Cervicomedullary junction and  pituitary are within normal limits. Supratentorial scattered mostly subcortical white matter T2 and FLAIR hyperintensity is mild to moderate for age. No supratentorial cortical encephalomalacia. No chronic blood products. The deep gray nuclei remain normal. Vascular: Major intracranial vascular flow voids are preserved. Skull and upper cervical spine: Negative visible cervical spine. Visualized bone marrow signal is within normal limits. Sinuses/Orbits: Negative. Other: Mastoids are clear. Visible internal auditory structures appear normal. Negative scalp and face soft tissues. IMPRESSION: 1. Acute to subacute brainstem infarct in the right pons. No associated hemorrhage or mass effect. 2. Additional chronic appearing signal changes in the pons, and also confluent abnormal signal in the cerebellar peduncleswhich is felt due to developing Wallerian degeneration. 3. Comparatively mild supratentorial white matter signal changes. Electronically Signed   By: Odessa FlemingH  Hall M.D.   On: 11/27/2018 21:33     Discharge Exam: Vitals:   11/29/18 0748 11/29/18 1307  BP: (!) 151/66 (!) 157/85  Pulse: 65 70  Resp:    Temp: 98.3 F (36.8 C) 99.2 F (37.3 C)  SpO2: 96% 99%   Vitals:   11/28/18 2319 11/29/18 0348 11/29/18 0748 11/29/18 1307  BP: (!) 184/100 (!) 155/59 (!) 151/66 (!) 157/85  Pulse: 75 70 65 70  Resp: 18 18    Temp: 98.7 F (37.1 C) 98.6 F (37 C) 98.3 F (36.8 C) 99.2 F (37.3 C)  TempSrc: Oral Oral Oral Oral  SpO2: 100% 99% 96% 99%  Weight:      Height:        General: Pt is alert, awake, not in acute distress Cardiovascular: RRR, S1/S2 +, no rubs, no gallops Respiratory: CTA bilaterally, no wheezing, no rhonchi Abdominal: Soft, NT, ND, bowel sounds + Extremities: no edema, no cyanosis Neuro:  Very minimal weakness on the left upper and lower extremity.  No other focal deficit.    The results of significant diagnostics from this hospitalization (including imaging, microbiology,  ancillary and laboratory) are listed below for reference.     Microbiology: Recent Results (from the past 240 hour(s))  SARS Coronavirus 2 by RT PCR (hospital order, performed in [facility] hospital lab) Nasopharyngeal Nasopharyngeal Swab     Status: None   Collection Time: 11/27/18  5:24 PM   Specimen: Nasopharyngeal Swab  Result Value Ref Range Status   SARS Coronavirus 2 NEGATIVE NEGATIVE Final    Comment: (NOTE) If result is NEGATIVE SARS-CoV-2 target nucleic acids are NOT DETECTED. The SARS-CoV-2 RNA is generally detectable in upper and lower  respiratory specimens during the acute phase of infection. The lowest  concentration of SARS-CoV-2 viral copies this assay can detect is 250  copies / mL. A negative result does not preclude SARS-CoV-2 infection  and should not be used as the sole basis for treatment or other  patient management decisions.  A negative result may occur with  improper specimen collection / handling, submission of specimen other  than nasopharyngeal swab, presence of viral mutation(s) within the  areas targeted by this assay, and inadequate number of viral copies  (<250 copies / mL). A negative result must be combined with clinical  observations, patient history, and epidemiological information. If result is POSITIVE SARS-CoV-2 target nucleic acids are DETECTED. The SARS-CoV-2 RNA is generally detectable in upper and lower  respiratory specimens dur ing the acute phase of infection.  Positive  results are indicative of active infection with SARS-CoV-2.  Clinical  correlation with patient history and other diagnostic information is  necessary to determine patient infection status.  Positive results do  not rule out bacterial infection or co-infection with other viruses. If result is PRESUMPTIVE POSTIVE SARS-CoV-2 nucleic acids MAY BE PRESENT.   A presumptive positive result was obtained on the submitted specimen  and confirmed on repeat testing.  While  2019 novel coronavirus  (SARS-CoV-2) nucleic acids may be present in the submitted sample  additional confirmatory testing may be necessary for epidemiological  and / or clinical management purposes  to differentiate between  SARS-CoV-2 and other Sarbecovirus currently known to infect humans.  If clinically indicated additional testing with an alternate test  methodology [phone]) is advised. The SARS-CoV-2 RNA is generally  detectable in upper and lower respiratory sp ecimens during the acute  phase of infection. The expected result is Negative. Fact Sheet for Patients:  BoilerBrush.com.cy Fact Sheet for Healthcare Providers: https://pope.com/ This test is not yet approved or cleared by the [place] FDA and has been authorized for detection and/or diagnosis of SARS-CoV-2 by FDA under an Emergency Use Authorization (EUA).  This EUA will remain in effect (meaning this test can be used) for the duration of the COVID-19 declaration under Section 564(b)(1) of the Act, 21 U.S.C. section 360bbb-3(b)(1), unless the authorization is terminated or revoked sooner. Performed at [facility], [address] [place], [place] [address]      Labs: BNP (last 3 results) No results for input(s): BNP in the last 8760 hours. Basic Metabolic Panel: Recent Labs  Lab 11/27/18 1353 11/28/18 0334 11/29/18 0404  NA 137 137 136  K 3.8 3.9 3.9  CL 107 103 103  CO2 22 21* 23  GLUCOSE 181* 227* 271*  BUN 21* 17 15  CREATININE 1.10* 1.12* 0.82  CALCIUM 8.7* 8.8* 9.2  Liver Function Tests: Recent Labs  Lab 11/27/18 1353  AST 19  ALT 18  ALKPHOS 78  BILITOT 0.3  PROT 6.4*  ALBUMIN 3.3*   No results for input(s): LIPASE, AMYLASE in the last 168 hours. No results for input(s): AMMONIA in the last 168 hours. CBC: Recent Labs  Lab 11/27/18 1323 11/28/18 0334 11/29/18 0404  WBC 10.0 8.5 6.7  NEUTROABS 6.6  --  4.1  HGB 13.3  11.9* 12.0  HCT 41.8 35.5* 36.7  MCV 85.3 82.6 83.0  PLT 231 236 229   Cardiac Enzymes: No results for input(s): CKTOTAL, CKMB, CKMBINDEX, TROPONINI in the last 168 hours. BNP: Invalid input(s): POCBNP CBG: Recent Labs  Lab 11/28/18 1548 11/28/18 1715 11/28/18 2129 11/29/18 0615 11/29/18 1223  GLUCAP 368* 351* 171* 264* 251*   D-Dimer No results for input(s): DDIMER in the last 72 hours. Hgb A1c Recent Labs    11/28/18 0334  HGBA1C 11.3*   Lipid Profile Recent Labs    11/28/18 0334  CHOL 272*  HDL 40*  LDLCALC 166*  TRIG 332*  CHOLHDL 6.8   Thyroid function studies No results for input(s): TSH, T4TOTAL, T3FREE, THYROIDAB in the last 72 hours.  Invalid input(s): FREET3 Anemia work up No results for input(s): VITAMINB12, FOLATE, FERRITIN, TIBC, IRON, RETICCTPCT in the last 72 hours. Urinalysis    Component Value Date/Time   COLORURINE YELLOW 11/27/2018 1640   APPEARANCEUR CLOUDY (A) 11/27/2018 1640   LABSPEC >1.030 (H) 11/27/2018 1640   PHURINE 5.5 11/27/2018 1640   GLUCOSEU 250 (A) 11/27/2018 1640   HGBUR TRACE (A) 11/27/2018 1640   BILIRUBINUR NEGATIVE 11/27/2018 1640   KETONESUR 15 (A) 11/27/2018 1640   PROTEINUR 100 (A) 11/27/2018 1640   NITRITE NEGATIVE 11/27/2018 1640   LEUKOCYTESUR SMALL (A) 11/27/2018 1640   Sepsis Labs Invalid input(s): PROCALCITONIN,  WBC,  LACTICIDVEN Microbiology Recent Results (from the past 240 hour(s))  SARS Coronavirus 2 by RT PCR (hospital order, performed in [facility] hospital lab) Nasopharyngeal Nasopharyngeal Swab     Status: None   Collection Time: 11/27/18  5:24 PM   Specimen: Nasopharyngeal Swab  Result Value Ref Range Status   SARS Coronavirus 2 NEGATIVE NEGATIVE Final    Comment: (NOTE) If result is NEGATIVE SARS-CoV-2 target nucleic acids are NOT DETECTED. The SARS-CoV-2 RNA is generally detectable in upper and lower  respiratory specimens during the acute phase of infection. The lowest  concentration  of SARS-CoV-2 viral copies this assay can detect is 250  copies / mL. A negative result does not preclude SARS-CoV-2 infection  and should not be used as the sole basis for treatment or other  patient management decisions.  A negative result may occur with  improper specimen collection / handling, submission of specimen other  than nasopharyngeal swab, presence of viral mutation(s) within the  areas targeted by this assay, and inadequate number of viral copies  (<250 copies / mL). A negative result must be combined with clinical  observations, patient history, and epidemiological information. If result is POSITIVE SARS-CoV-2 target nucleic acids are DETECTED. The SARS-CoV-2 RNA is generally detectable in upper and lower  respiratory specimens dur ing the acute phase of infection.  Positive  results are indicative of active infection with SARS-CoV-2.  Clinical  correlation with patient history and other diagnostic information is  necessary to determine patient infection status.  Positive results do  not rule out bacterial infection or co-infection with other viruses. If result is PRESUMPTIVE POSTIVE SARS-CoV-2 nucleic acids  MAY BE PRESENT.   A presumptive positive result was obtained on the submitted specimen  and confirmed on repeat testing.  While 2019 novel coronavirus  (SARS-CoV-2) nucleic acids may be present in the submitted sample  additional confirmatory testing may be necessary for epidemiological  and / or clinical management purposes  to differentiate between  SARS-CoV-2 and other Sarbecovirus currently known to infect humans.  If clinically indicated additional testing with an alternate test  methodology [phone]) is advised. The SARS-CoV-2 RNA is generally  detectable in upper and lower respiratory sp ecimens during the acute  phase of infection. The expected result is Negative. Fact Sheet for Patients:  BoilerBrush.com.cy Fact Sheet for Healthcare  Providers: https://pope.com/ This test is not yet approved or cleared by the [place] FDA and has been authorized for detection and/or diagnosis of SARS-CoV-2 by FDA under an Emergency Use Authorization (EUA).  This EUA will remain in effect (meaning this test can be used) for the duration of the COVID-19 declaration under Section 564(b)(1) of the Act, 21 U.S.C. section 360bbb-3(b)(1), unless the authorization is terminated or revoked sooner. Performed at [facility], [address] [place], [place] [address]      Time coordinating discharge: Over 30 minutes  SIGNED:   Hughie Closs, MD  [facility] 11/29/2018, 1:30 PM  If 7PM-7AM, please contact night-coverage [email or web address] Password [email or web address]

## 2018-11-29 NOTE — Progress Notes (Signed)
STROKE TEAM PROGRESS NOTE   INTERVAL HISTORY Sitting up on edge of bed. No new complaints. Echo was normal.wants to go home  Vitals:   11/28/18 2319 11/29/18 0348 11/29/18 0748 11/29/18 1307  BP: (!) 184/100 (!) 155/59 (!) 151/66 (!) 157/85  Pulse: 75 70 65 70  Resp: 18 18    Temp: 98.7 F (37.1 C) 98.6 F (37 C) 98.3 F (36.8 C) 99.2 F (37.3 C)  TempSrc: Oral Oral Oral Oral  SpO2: 100% 99% 96% 99%  Weight:      Height:        CBC:  Recent Labs  Lab 11/27/18 1323 11/28/18 0334 11/29/18 0404  WBC 10.0 8.5 6.7  NEUTROABS 6.6  --  4.1  HGB 13.3 11.9* 12.0  HCT 41.8 35.5* 36.7  MCV 85.3 82.6 83.0  PLT 231 236 229    Basic Metabolic Panel:  Recent Labs  Lab 11/28/18 0334 11/29/18 0404  NA 137 136  K 3.9 3.9  CL 103 103  CO2 21* 23  GLUCOSE 227* 271*  BUN 17 15  CREATININE 1.12* 0.82  CALCIUM 8.8* 9.2   Lipid Panel:     Component Value Date/Time   CHOL 272 (H) 11/28/2018 0334   TRIG 332 (H) 11/28/2018 0334   HDL 40 (L) 11/28/2018 0334   CHOLHDL 6.8 11/28/2018 0334   VLDL 66 (H) 11/28/2018 0334   LDLCALC 166 (H) 11/28/2018 0334   HgbA1c:  Lab Results  Component Value Date   HGBA1C 11.3 (H) 11/28/2018   Urine Drug Screen:     Component Value Date/Time   LABOPIA NONE DETECTED 11/27/2018 1640   COCAINSCRNUR NONE DETECTED 11/27/2018 1640   LABBENZ NONE DETECTED 11/27/2018 1640   AMPHETMU NONE DETECTED 11/27/2018 1640   THCU NONE DETECTED 11/27/2018 1640   LABBARB NONE DETECTED 11/27/2018 1640    Alcohol Level     Component Value Date/Time   ETH <10 11/27/2018 1323    IMAGING Ct Angio Head W Or Wo Contrast  Result Date: 11/27/2018 CLINICAL DATA:  Focal neuro deficit, greater than 6 hours, stroke suspected. Additional history provided: Patient presents with left-sided weakness that began Saturday unknown time. Reports headache on Friday and not feeling well throughout the weekend, feeling off balance and heaviness of left side. EXAM: CT  ANGIOGRAPHY HEAD AND NECK TECHNIQUE: Multidetector CT imaging of the head and neck was performed using the standard protocol during bolus administration of intravenous contrast. Multiplanar CT image reconstructions and MIPs were obtained to evaluate the vascular anatomy. Carotid stenosis measurements (when applicable) are obtained utilizing NASCET criteria, using the distal internal carotid diameter as the denominator. CONTRAST:  75mL OMNIPAQUE IOHEXOL 350 MG/ML SOLN COMPARISON:  Noncontrast head CT performed earlier the same day 11/27/2018. FINDINGS: CT HEAD FINDINGS No evidence of acute intracranial hemorrhage. No demarcated cortical infarction. No evidence of intracranial mass. No midline shift or extra-axial fluid collection. Cerebral volume is normal for age. Vascular: Reported separately Skull: Normal. Negative for fracture or focal lesion. Sinuses: No significant paranasal sinus disease or mastoid effusion. Orbits: Visualized orbits demonstrate no acute abnormality. Review of the MIP images confirms the above findings CTA NECK FINDINGS Aortic arch: Common origin of the innominate and left common carotid arteries. Included portions of the aortic arch demonstrate no evidence of dissection or aneurysm. Atherosclerotic plaque within the visualized aortic arch and proximal major branch vessels of the neck. Right carotid system: Common and internal carotid arteries patent within the neck without stenosis. Minimal soft and calcified  plaque within the proximal ICA. Left carotid system: Common carotid artery patent without stenosis. There is significant plaque within the carotid bifurcation and proximal ICA. It is difficult to accurately measure the degree of stenosis at the origin of the left ICA due to irregularity of soft and calcified plaque at this site. There may be greater than 50% stenosis at the origin of the left ICA. Distal to this the ICA is patent within the neck without significant stenosis. Vertebral  arteries: Right vertebral artery dominant. The vertebral arteries are patent within the neck bilaterally without significant stenosis. Skeleton: No acute bony abnormality. Cervical spondylosis greatest at C5-C6 and C6-C7 where there is moderate disc degeneration small posterior disc osteophytes. Other neck: No soft tissue neck mass or pathologically enlarged cervical chain lymph nodes. Thyroid negative. Upper chest: Patchy ground-glass opacity/mosaic attenuation within the imaged lung apices may reflect mild edema or sequela of small airways disease. Review of the MIP images confirms the above findings CTA HEAD FINDINGS Anterior circulation: Soft and calcified plaque within the cavernous and paraclinoid right internal carotid artery. Sites of mild to moderate stenosis within the cavernous right ICA. Moderate focal stenosis within the paraclinoid right ICA (series 11, image 247). Soft and calcified plaque within the cavernous and paraclinoid left internal carotid artery. Sites of at least moderate stenosis within the cavernous left ICA. The right middle and anterior cerebral arteries are patent without significant proximal stenosis. The left middle and anterior cerebral arteries are patent without significant proximal stenosis. No intracranial aneurysm is identified. Posterior circulation: Significant soft and calcified plaque within the intracranial right vertebral artery with sites of up to moderate stenosis. The left vertebral artery is patent without significant stenosis. Atherosclerotic irregularity of the basilar artery with noncalcified plaque. Focal moderate stenosis within the mid to distal basilar artery. Significant atherosclerotic irregularity of the bilateral posterior cerebral arteries. Mild-to-moderate focal stenosis within the right PCA at the P1-P2 junction. Moderate/severe focal stenosis within the P2 left posterior cerebral artery. Venous sinuses: Within limitations of contrast timing, no  convincing thrombus. Anatomic variants: Posterior cerebral arteries are poorly delineated bilaterally and may be hypoplastic or absent. Review of the MIP images confirms the above findings IMPRESSION: CT head: No CT evidence of acute intracranial abnormality. Consider brain MRI for further evaluation, as clinically warranted. CTA head: 1. Intracranial atherosclerotic disease with multifocal stenoses, most notably as follows. 2. Moderate stenosis within the paraclinoid right internal carotid artery. 3. Sites of at least moderate stenosis within the cavernous left internal carotid artery. 4. Sites of up to moderate stenosis within the intracranial right vertebral artery. 5. Focal moderate stenosis within the mid to distal basilar artery. 6. Focal moderate/severe stenosis within the P2 left posterior cerebral artery. CTA neck: 1. Common and internal carotid arteries patent within the neck bilaterally. It is difficult to accurately quantify the degree of stenosis at the origin of the left ICA due to irregularity of prominent soft and calcified plaque at this site. Greater than 50% stenosis may be present. Carotid artery duplex recommended for further evaluation. 2. Bilateral vertebral arteries patent within the neck without stenosis. 3. Patchy ground-glass opacity/mosaic attenuation within the imaged lung apices, which may reflect edema or sequela of small airways disease. Electronically Signed   By: Jackey Loge   On: 11/27/2018 18:56   Ct Angio Neck W And/or Wo Contrast  Result Date: 11/27/2018 CLINICAL DATA:  Focal neuro deficit, greater than 6 hours, stroke suspected. Additional history provided: Patient presents with left-sided weakness that  began Saturday unknown time. Reports headache on Friday and not feeling well throughout the weekend, feeling off balance and heaviness of left side. EXAM: CT ANGIOGRAPHY HEAD AND NECK TECHNIQUE: Multidetector CT imaging of the head and neck was performed using the standard  protocol during bolus administration of intravenous contrast. Multiplanar CT image reconstructions and MIPs were obtained to evaluate the vascular anatomy. Carotid stenosis measurements (when applicable) are obtained utilizing NASCET criteria, using the distal internal carotid diameter as the denominator. CONTRAST:  75mL OMNIPAQUE IOHEXOL 350 MG/ML SOLN COMPARISON:  Noncontrast head CT performed earlier the same day 11/27/2018. FINDINGS: CT HEAD FINDINGS No evidence of acute intracranial hemorrhage. No demarcated cortical infarction. No evidence of intracranial mass. No midline shift or extra-axial fluid collection. Cerebral volume is normal for age. Vascular: Reported separately Skull: Normal. Negative for fracture or focal lesion. Sinuses: No significant paranasal sinus disease or mastoid effusion. Orbits: Visualized orbits demonstrate no acute abnormality. Review of the MIP images confirms the above findings CTA NECK FINDINGS Aortic arch: Common origin of the innominate and left common carotid arteries. Included portions of the aortic arch demonstrate no evidence of dissection or aneurysm. Atherosclerotic plaque within the visualized aortic arch and proximal major branch vessels of the neck. Right carotid system: Common and internal carotid arteries patent within the neck without stenosis. Minimal soft and calcified plaque within the proximal ICA. Left carotid system: Common carotid artery patent without stenosis. There is significant plaque within the carotid bifurcation and proximal ICA. It is difficult to accurately measure the degree of stenosis at the origin of the left ICA due to irregularity of soft and calcified plaque at this site. There may be greater than 50% stenosis at the origin of the left ICA. Distal to this the ICA is patent within the neck without significant stenosis. Vertebral arteries: Right vertebral artery dominant. The vertebral arteries are patent within the neck bilaterally without  significant stenosis. Skeleton: No acute bony abnormality. Cervical spondylosis greatest at C5-C6 and C6-C7 where there is moderate disc degeneration small posterior disc osteophytes. Other neck: No soft tissue neck mass or pathologically enlarged cervical chain lymph nodes. Thyroid negative. Upper chest: Patchy ground-glass opacity/mosaic attenuation within the imaged lung apices may reflect mild edema or sequela of small airways disease. Review of the MIP images confirms the above findings CTA HEAD FINDINGS Anterior circulation: Soft and calcified plaque within the cavernous and paraclinoid right internal carotid artery. Sites of mild to moderate stenosis within the cavernous right ICA. Moderate focal stenosis within the paraclinoid right ICA (series 11, image 247). Soft and calcified plaque within the cavernous and paraclinoid left internal carotid artery. Sites of at least moderate stenosis within the cavernous left ICA. The right middle and anterior cerebral arteries are patent without significant proximal stenosis. The left middle and anterior cerebral arteries are patent without significant proximal stenosis. No intracranial aneurysm is identified. Posterior circulation: Significant soft and calcified plaque within the intracranial right vertebral artery with sites of up to moderate stenosis. The left vertebral artery is patent without significant stenosis. Atherosclerotic irregularity of the basilar artery with noncalcified plaque. Focal moderate stenosis within the mid to distal basilar artery. Significant atherosclerotic irregularity of the bilateral posterior cerebral arteries. Mild-to-moderate focal stenosis within the right PCA at the P1-P2 junction. Moderate/severe focal stenosis within the P2 left posterior cerebral artery. Venous sinuses: Within limitations of contrast timing, no convincing thrombus. Anatomic variants: Posterior cerebral arteries are poorly delineated bilaterally and may be  hypoplastic or absent. Review of  the MIP images confirms the above findings IMPRESSION: CT head: No CT evidence of acute intracranial abnormality. Consider brain MRI for further evaluation, as clinically warranted. CTA head: 1. Intracranial atherosclerotic disease with multifocal stenoses, most notably as follows. 2. Moderate stenosis within the paraclinoid right internal carotid artery. 3. Sites of at least moderate stenosis within the cavernous left internal carotid artery. 4. Sites of up to moderate stenosis within the intracranial right vertebral artery. 5. Focal moderate stenosis within the mid to distal basilar artery. 6. Focal moderate/severe stenosis within the P2 left posterior cerebral artery. CTA neck: 1. Common and internal carotid arteries patent within the neck bilaterally. It is difficult to accurately quantify the degree of stenosis at the origin of the left ICA due to irregularity of prominent soft and calcified plaque at this site. Greater than 50% stenosis may be present. Carotid artery duplex recommended for further evaluation. 2. Bilateral vertebral arteries patent within the neck without stenosis. 3. Patchy ground-glass opacity/mosaic attenuation within the imaged lung apices, which may reflect edema or sequela of small airways disease. Electronically Signed   By: Jackey Loge   On: 11/27/2018 18:56   Mr Brain Wo Contrast  Result Date: 11/27/2018 CLINICAL DATA:  58 year old female with left side weakness for several days. EXAM: MRI HEAD WITHOUT CONTRAST TECHNIQUE: Multiplanar, multiecho pulse sequences of the brain and surrounding structures were obtained without intravenous contrast. COMPARISON:  CT head and CTA head and neck earlier today. FINDINGS: Brain: Confluent restricted diffusion in the right pons extending to the midline in an area of about 12 millimeters (series 5, image 8). Associated T2 and FLAIR hyperintensity, and T1 hypointensity, with no hemorrhage or mass effect.  Additional T2/FLAIR heterogeneity elsewhere in the pons, and also in the bilateral cerebellar peduncles (confluent on the right). But no other restricted diffusion. No midline shift, mass effect, evidence of mass lesion, ventriculomegaly, extra-axial collection or acute intracranial hemorrhage. Cervicomedullary junction and pituitary are within normal limits. Supratentorial scattered mostly subcortical white matter T2 and FLAIR hyperintensity is mild to moderate for age. No supratentorial cortical encephalomalacia. No chronic blood products. The deep gray nuclei remain normal. Vascular: Major intracranial vascular flow voids are preserved. Skull and upper cervical spine: Negative visible cervical spine. Visualized bone marrow signal is within normal limits. Sinuses/Orbits: Negative. Other: Mastoids are clear. Visible internal auditory structures appear normal. Negative scalp and face soft tissues. IMPRESSION: 1. Acute to subacute brainstem infarct in the right pons. No associated hemorrhage or mass effect. 2. Additional chronic appearing signal changes in the pons, and also confluent abnormal signal in the cerebellar peduncleswhich is felt due to developing Wallerian degeneration. 3. Comparatively mild supratentorial white matter signal changes. Electronically Signed   By: Odessa Fleming M.D.   On: 11/27/2018 21:33    PHYSICAL EXAM Pleasant middle-aged lady currently not in distress. . Afebrile. Head is nontraumatic. Neck is supple without bruit.    Cardiac exam no murmur or gallop. Lungs are clear to auscultation. Distal pulses are well felt. Neurological Exam  Awake alert oriented x 3 normal speech and language. Mild left lower face asymmetry. Tongue midline. Mild LUE and LLE drift.left hemiplegia 4/5 strength. Mild diminished fine finger movements on left. Orbits right over left upper extremity. Mild left grip weak.. Normal sensation . Normal coordination. ASSESSMENT/PLAN Ms. Tiffany Schmitt is a 58 y.o. female  with history of HTN, DB presenting to [facility] with L sided weakness x 5 days, had HA at onset..    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   CTA head intracranial atherosclerotic dz (moderate R paraclinoid ICA, L cavernous ICA, R VA, mid to distal BA, and L P2 moderate to severe)  CTA neck L ICA origin >/= 50. Patchy ground glass lung apices  MRI  R pontine infarct. Developing Wallerian degeneration. Mild supratentorial white matter changes  2D Echo EF 60-65%. No source of embolus    LDL 166  HgbA1c 11.3  Lovenox 40 mg sq daily for VTE prophylaxis  No antithrombotic prior to admission, now on aspirin 325 mg daily. Plan DAPT x 3 weeks then aspirin alone. Orders adjusted.   Therapy recommendations:  HH PT, HH OT, no SLP  Disposition:  Return home [place] for d/c from stroke standpoint Follow-up Stroke Clinic at [facility] in 4 weeks. Office will call with appointment date and time. Order placed.  Hypertension  Stable . Permissive hypertension (OK if < 220/120) but gradually normalize in 5-7 days . Long-term BP goal normotensive  Hyperlipidemia  Home meds:  No statin   Now on lipitor 40  LDL 166, goal < 70  Continue statin at discharge  Diabetes type II Uncontrolled  HgbA1c 11.3, goal < 7.0  Other Stroke Risk Factors  Obesity, Body mass index is 38.7 kg/m., recommend weight loss, diet and exercise as appropriate   Hospital day # 1    She presented with several days of left sided weakness secondary to right pontine infarct from small vessel disease.  Recommend aspirin Plavix for 3 weeks followed by aspirin alone and aggressive risk factor modification.  .  Patient was counseled to eat a healthy diet and exercise regularly and be compliant with her follow-up and is agreeable. Stroke team will sign off .Call for questions. Antony Contras, MD Medical Director [facility] Stroke  [facility] Pager: [phone] 11/29/2018 3:04 PM   To contact Stroke Continuity provider, please refer to [email or web address] After hours, contact General Neurology

## 2018-11-29 NOTE — Discharge Instructions (Signed)
Eating Plan After Stroke A stroke causes damage to the brain cells, which can affect your ability to walk, talk, and even eat. The impact of a stroke is different for everyone, and so is recovery. A good nutrition plan is important for your recovery. It can also lower your risk of another stroke. If you have difficulty chewing and swallowing your food, a dietitian or your stroke care team can help so that you can enjoy eating healthy foods. What are tips for following this plan?  Reading food labels  Choose foods that have less than 300 milligrams (mg) of sodium per serving. Limit your sodium intake to less than 1,500 mg per day.  Avoid foods that have saturated fat and trans fat.  Choose foods that are low in cholesterol. Limit the amount of cholesterol you eat each day to less than 200 mg.  Choose foods that are high in fiber. Eat 20-30 grams (g) of fiber each day.  Avoid foods with added sugar. Check the food label for ingredients such as sugar, corn syrup, honey, fructose, molasses, and cane juice. Shopping  At the grocery store, buy most of your food from areas near the walls of the store. This includes: ? Fresh fruits and vegetables. ? Dry grains, beans, nuts, and seeds. ? Fresh seafood, poultry, lean meats, and eggs. ? Low-fat dairy products.  Buy whole ingredients instead of prepackaged foods.  Buy fresh, in-season fruits and vegetables from local farmers markets.  Buy frozen fruits and vegetables in resealable bags. Cooking  Prepare foods with very little salt. Use herbs or salt-free spices instead.  Cook with heart-healthy oils, such as olive, avocado, canola, soybean, or sunflower oil.  Avoid frying foods. Bake, grill, or broil foods instead.  Remove visible fat and skin from meat and poultry before eating.  Modify food textures as told by your health care provider. Meal planning  Eat a wide variety of colorful fruits and vegetables. Make sure one-half of your  plate is filled with fruits and vegetables at each meal.  Eat fruits and vegetables that are high in potassium, such as: ? Apples, bananas, oranges, and melon. ? Sweet potatoes, spinach, zucchini, and tomatoes.  Eat fish that contain heart-healthy fats (omega-3 fats) at least twice a week. These include salmon, tuna, mackerel, and sardines.  Eat plant foods that are high in omega-3 fats, such as flaxseeds and walnuts. Add these to cereals, yogurt, or pasta dishes.  Eat several servings of high-fiber foods each day, such as fruits, vegetables, whole grains, and beans.  Do not put salt at the table for meals.  When eating out at restaurants: ? Ask the server about low-salt or salt-free food options. ? Avoid fried foods. Look for menu items that are grilled, steamed, broiled, or roasted. ? Ask if your food can be prepared without butter. ? Ask for condiments, such as salad dressings, gravy, or sauces to be served on the side.  If you have difficulty swallowing: ? Choose foods that are softer and easier to chew and swallow. ? Cut foods into small pieces and chew well before swallowing. ? Thicken liquids as told by your health care provider or dietitian. ? Let your health care provider know if your condition does not improve over time. You may need to work with a speech therapist to re-train the muscles that are used for eating. General recommendations  Involve your family and friends in your recovery, if possible. It may be helpful to have a slower meal  time and to plan meals that include foods everyone in the family can eat.  Brush your teeth with fluoride toothpaste twice a day, and floss once a day. Keeping a clean mouth can help you swallow and can also help your appetite.  Drink enough water each day to keep your urine pale yellow. If needed, set reminders or ask your family to help you remember to drink water.  Limit alcohol intake to no more than 1 drink a day for nonpregnant  women and 2 drinks a day for men. One drink equals 12 oz of beer, 5 oz of wine, or 1 oz of hard liquor. Summary  Following this eating plan can help in your stroke recovery and can decrease your risk for another stroke.  Let your health care provider know if you have problems with swallowing. You may need to work with a speech therapist. This information is not intended to replace advice given to you by your health care provider. Make sure you discuss any questions you have with your health care provider. Document Released: 04/09/2017 Document Revised: 05/23/2018 Document Reviewed: 04/09/2017 Elsevier Patient Education  2020 [employer].   Cognitive Rehabilitation After a Stroke After a stroke, you may have various problems with thinking (cognitive disability). The types of problems you have will depend on how severe the stroke was and where it was located in the brain. Problems may include:  Problems with short-term memory.  Trouble paying attention.  Trouble communicating or understanding language (aphasia).  A drop in mental ability that may interfere with daily life (dementia).  Trouble with problem-solving and information processing.  Problems with reading, writing, or math.  Problems with your ability to plan and to perform activities in sequence (executive function). These problems can feel overwhelming. However, with rehabilitation and time to heal, many people have improvement in their symptoms. What causes cognitive disability? A stroke happens when blood cannot flow to certain areas of the brain. When this happens, brain cells die in the affected areas because they cannot get oxygen and nutrients from the blood. Cognitive disability is caused by the death of cells in the areas of the brain that control thinking. What is cognitive rehabilitation? Cognitive rehabilitation is a program to help you improve your thinking skills after a stroke. Rehabilitation cannot completely  reverse the effects of a stroke, but it can help you with memory, problem-solving, and communication skills. Therapy focuses on:  Improving brain function. This may involve activities such as learning to break down tasks into simple steps.  Helping you learn ways to cope with thinking problems. For example, you might learn memory tricks or do activities that stimulate memory, such as naming objects or describing pictures. Cognitive rehabilitation may include:  Speech-language therapy to help you understand and use language to communicate.  Occupational therapy to help you perform daily activities.  Music therapy to help relieve stress, anxiety, and depression. This may involve listening to music, singing, or playing instruments.  Physical therapy to help improve your ability to move and perform actions that involve the muscles (motor functions). When will therapy start and where will I have therapy? Your health care provider will decide when it is best for you to start therapy. In some cases, people start rehabilitation as soon as their health is stable, which may be 24-48 hours after the stroke. Rehabilitation can take place in a few different places, based on your needs. It may take place in:  The hospital or an in-patient rehabilitation hospital.  An outpatient rehabilitation facility.  A long-term care facility.  A community rehabilitation clinic.  Your home. What are some tools to help after a stroke? There are a number of tools and apps that you can use on your smartphone, personal computer, or tablet to help improve brain function. Some of these apps include:  Calendar reminders or alarm apps to help with memory.  Note-taking or sketch pad apps to help with memory or communication.  Text-to-speech apps that allow you to listen to what you are reading, which helps your ability to understanding text.  Picture dictionary or picture message apps to help with  communication.  E-readers. These can highlight text as it is read aloud, which helps with listening and reading skills. How can my friends or family help during my rehabilitation? During your recovery, it is important that your friends and family members help you work toward more independence. Your caregivers should speak with your health care providers to learn how they can best help you during recovery. This may include working on speech-language or memory exercises at home, or helping with daily tasks and errands. If you have cognitive disability, you may be at risk for injury or accidents at home, such as forgetting to turn off the stove. Friends and family members can help ensure home safety by taking steps such as getting appliances with automatic shut-off features or storing dangerous objects in a secure place. What else should I know about cognitive rehabilitation after a stroke? Having trouble with memory and problem-solving can make you feel alone. You may also have mood changes, anxiety, or depression after a stroke. It is important to:  Stay connected with others through social groups, online support groups, or your community.  Talk to your friends, family, and caregivers about any emotional problems you are having.  Go to one-on-one or group therapy as suggested by your health care provider.  Stay physically active and exercise as often as suggested by your health care provider. Summary  After a stroke, some people have problems with thinking that affect attention, memory, language, communication, and problem-solving.  Cognitive rehabilitation is a program to help you regain brain function and learn skills to cope with thinking problems.  Rehabilitation cannot completely reverse the effects of a stroke, but it can help to improve quality of life.  Cognitive rehabilitation may include speech-language therapy, occupational therapy, music therapy, and physical therapy. This  information is not intended to replace advice given to you by your health care provider. Make sure you discuss any questions you have with your health care provider. Document Released: 05/05/2016 Document Revised: 05/22/2018 Document Reviewed: 05/05/2016 Elsevier Patient Education  2020 [employer] Ischemic Stroke  An ischemic stroke is the sudden death of brain tissue. Blood carries oxygen to all areas of the body. This type of stroke happens when your blood does not flow to your brain like normal. Your brain cannot get the oxygen it needs. This is an emergency. It must be treated right away. Symptoms of a stroke usually happen all of a sudden. You may notice them when you wake up. They can include:  Weakness or loss of feeling in your face, arm, or leg. This often happens on one side of the body.  Trouble walking.  Trouble moving your arms or legs.  Loss of balance or coordination.  Feeling confused.  Trouble talking or understanding what people are saying.  Slurred speech.  Trouble seeing.  Seeing two of one object (double vision).  Feeling  dizzy.  Feeling sick to your stomach (nauseous) and throwing up (vomiting).  A very bad headache for no reason. Get help as soon as any of these problems start. This is important. Some treatments work better if they are given right away. These include:  Aspirin.  Medicines to control blood pressure.  A shot (injection) of medicine to break up the blood clot.  Treatments given in the blood vessel (artery) to take out the clot or break it up. Other treatments may include:  Oxygen.  Fluids given through an IV tube.  Medicines to thin out your blood.  Procedures to help your blood flow better. What increases the risk? Certain things may make you more likely to have a stroke. Some of these are things that you can change, such as:  Being very overweight (obesity).  Smoking.  Taking birth control pills.  Not being  active.  Drinking too much alcohol.  Using drugs. Other risk factors include:  High blood pressure.  High cholesterol.  Diabetes.  Heart disease.  Being [place] American, Native American, Hispanic, or [place] Native.  Being over age 46.  Family history of stroke.  Having had blood clots, stroke, or warning stroke (transient ischemic attack, TIA) in the past.  Sickle cell disease.  Being a woman with a history of high blood pressure in pregnancy (preeclampsia).  Migraine headache.  Sleep apnea.  Having an irregular heartbeat (atrial fibrillation).  Long-term (chronic) diseases that cause soreness and swelling (inflammation).  Disorders that affect how your blood clots. Follow these instructions at home: Medicines  Take over-the-counter and prescription medicines only as told by your doctor.  If you were told to take aspirin or another medicine to thin your blood, take it exactly as told by your doctor. ? Taking too much of the medicine can cause bleeding. ? If you do not take enough, it may not work as well.  Know the side effects of your medicines. If you are taking a blood thinner, make sure you: ? Hold pressure over any cuts for longer than usual. ? Tell your dentist and other doctors that you take this medicine. ? Avoid activities that may cause damage or injury to your body. Eating and drinking  Follow instructions from your doctor about what you cannot eat or drink.  Eat healthy foods.  If you have trouble with swallowing, do these things to avoid choking: ? Take small bites when eating. ? Eat foods that are soft or pureed. Safety  Follow instructions from your health care team about physical activity.  Use a walker or cane as told by your doctor.  Keep your home safe so you do not fall. This may include: ? Having experts look at your home to make sure it is safe. ? Putting grab bars in the bedroom and bathroom. ? Using raised toilets. ? Putting  a seat in the shower. General instructions  Do not use any tobacco products. ? Examples of these are cigarettes, chewing tobacco, and e-cigarettes. ? If you need help quitting, ask your doctor.  Limit how much alcohol you drink. This means no more than 1 drink a day for nonpregnant women and 2 drinks a day for men. One drink equals 12 oz of beer, 5 oz of wine, or 1 oz of hard liquor.  If you need help to stop using drugs or alcohol, ask your doctor to refer you to a program or specialist.  Stay active. Exercise as told by your doctor.  Keep all  follow-up visits as told by your doctor. This is important. Get help right away if:   You have any signs of a stroke. "BE FAST" is an easy way to remember the main warning signs: ? B - Balance. Signs are dizziness, sudden trouble walking, or loss of balance. ? E - Eyes. Signs are trouble seeing or a change in how you see. ? F - Face. Signs are sudden weakness or loss of feeling of the face, or the face or eyelid drooping on one side. ? A - Arms. Signs are weakness or loss of feeling in an arm. This happens suddenly and usually on one side of the body. ? S - Speech. Signs are sudden trouble speaking, slurred speech, or trouble understanding what people say. ? T - Time. Time to call emergency services. Write down what time symptoms started.  You have other signs of a stroke, such as: ? A sudden, very bad headache with no known cause. ? Feeling sick to your stomach (nausea). ? Throwing up (vomiting). ? Jerky movements you cannot control (seizure). These symptoms may be an emergency. Do not wait to see if the symptoms will go away. Get medical help right away. Call your local emergency services (911 in the [place] Do not drive yourself to the hospital. Summary  An ischemic stroke is the sudden death of brain tissue.  Symptoms of a stroke usually happen all of a sudden. You may notice them when you wake up.  Get help if you have any warning  signs of a stroke. This is important. Some treatments work better if they are given right away. This information is not intended to replace advice given to you by your health care provider. Make sure you discuss any questions you have with your health care provider. Document Released: 01/19/2011 Document Revised: 07/11/2017 Document Reviewed: 04/28/2015 Elsevier Patient Education  2020 [employer].

## 2018-11-29 NOTE — TOC Transition Note (Signed)
Transition of Care [facility]) - CM/SW Discharge Note   Patient Details  Name: Tiffany Schmitt MRN: [id number] Date of Birth: 09-30-1960  Transition of Care [facility]) CM/SW Contact:  Geralynn Ochs, LCSW Phone Number: 11/29/2018, 10:25 AM   Clinical Narrative:   Patient discharging home today, outpatient therapy referral and PCP established. No further DC needs identified.    Final next level of care: OP Rehab Barriers to Discharge: Barriers Resolved   Patient Goals and CMS Choice   CMS Medicare.gov Compare Post Acute Care list provided to:: Patient Choice offered to / list presented to : Patient  Discharge Placement                       Discharge Plan and Services   Discharge Planning Services: CM Consult                                 Social Determinants of Health (SDOH) Interventions     Readmission Risk Interventions No flowsheet data found.

## 2018-11-30 LAB — GLUCOSE, CAPILLARY: Glucose-Capillary: 234 mg/dL — ABNORMAL HIGH (ref 70–99)

## 2018-12-10 ENCOUNTER — Other Ambulatory Visit: Payer: Self-pay | Admitting: *Deleted

## 2018-12-10 NOTE — Patient Outreach (Signed)
[facility]) Care Management  12/10/2018  Ceonna Frazzini 1960-11-11 [id number]   Subjective: Telephone call to patient's home  / mobile number, no answer, left HIPAA compliant voicemail message, and requested call back.    Objective: Per KPN (Knowledge Performance Now, point of care tool) and chart review, patient hospitalized 11/27/2018 - 11/29/2018 for  Acute ischemic vertebrobasilar artery brainstem stroke, right ,   Acute ischemic stroke.   Patient had ED visit on 07/18/2018 for generalized weakness.   Patient also has a history of diabetes and hypertension.   Last A1C  11.3 on 11/28/2018.      Assessment: Received [employer] EMMI Stroke Google Alert follow up referral on 12/10/2018.   Red Flag Alert Triggers, times 2, Day #9, patient answered yes to the following questions:Questions/problems with meds?  Sad, hopeless, anxious, or empty?    Plan: RNCM will send unsuccessful outreach letter, [facility] pamphlet, handout: Know Before You Go, will call patient for 2nd telephone outreach attempt within 4 business days, [facility] EMMI follow up, and proceed with case closure, within 10 business days if no return call.     [place] Jeff[place] H. Annia Friendly, BSN, [place] Management [facility] Telephonic CM Phone: [phone] Fax: [phone]

## 2018-12-11 ENCOUNTER — Ambulatory Visit: Payer: Self-pay | Admitting: *Deleted

## 2018-12-12 ENCOUNTER — Other Ambulatory Visit: Payer: Self-pay | Admitting: *Deleted

## 2018-12-12 ENCOUNTER — Encounter: Payer: Self-pay | Admitting: *Deleted
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
## 2018-12-13 ENCOUNTER — Encounter: Payer: Self-pay | Admitting: *Deleted

## 2018-12-13 ENCOUNTER — Other Ambulatory Visit: Payer: Self-pay | Admitting: *Deleted

## 2018-12-13 NOTE — Patient Outreach (Signed)
[facility]) Care Management  12/13/2018  Jamesetta Greenhalgh [birth year] [id number]   CSW was able to make initial contact with patient today to perform phone assessment, as well as assess and assist with social work needs and services.  CSW introduced self, explained role and types of services provided through [place] [facility] ([place] [facility]).  CSW further explained to patient that CSW works with patient's Telephonic RNCM, also with [place] [facility], Sonda Rumble. CSW then explained the reason for the call, indicating that Mrs. Burt Knack thought that patient would benefit from social work services and resources to assist with counseling/bevioral health community resources, counseling and supportive services for symptoms of depression, financial assistance to help pay for medical expenses and medications, and assistance with obtaining in-home care services.  CSW received two HIPAA compliant identifiers from patient, which included patient's name and date of birth.  Patient receives outpatient physical and occupational therapies through [facility].    Patient did not have a primary care physician, prior to her Stroke, but was given a list of providers while hospitalized and has since chosen Dr. Joyce Gross, Family Medicine Physician with [facility].  Patient's initial consult with Dr. Radford Pax took place on Tuesday, December 10, 2018.  Patient will follow-up with Dr. Radford Pax again on Wednesday, March 12, 2019.  CSW reminded patient that she has a telephone screening scheduled for today (Friday, December 13, 2018) to consult with April Calhoun, Certified Medical Assistant with the Spine Specialist Department at [facility].  CSW went on to explain to patient that she has an upcoming neurology appointment with Buford Dresser, Neurology Nurse Practitioner with [facility], scheduled for Monday, December 16, 2018.  Last, patient has an appointment to follow-up with Dr. Eugenia Pancoast, Endocrinologist at [facility], on Tuesday, December 31, 2018.  Patient wrote down all of these appointments as CSW called them out to her, admitting that she now has problems with memory loss.   When inquiring about patient's willingness to receive counseling and supportive services through [place] with [facility], patient stated, "It would be nice to talk with someone about what I am going through, but it's not going to change my situation".  CSW voiced understanding, explaining to patient that CSW would be willing to offer free telephonic therapy sessions to patient each week, for a short-term period of time, at least until CSW is able to get patient established with a long-term therapist in the community.  Patient denied having an interested, stating, "I need motivational and spiritual healing, like a mind and soul searching experience".  CSW offered to use several therapeutic techniques with patient that incorporate mind, body and spirit, meditation, deep breathing exercises and relaxation techniques.  CSW then inquired about whether or not patient would be willing to attend a support group, either in public (with social distancing), digitally or telephonically, naming several that patient may be interested in.  Again, patient denied having an interest.  Patient admitted that she has no interest in getting out and socializing, not feeling like a support group/network would be a good option for her.  Patient indicated that she "hates talking on the phone"57 and does "not possess the skills necessary to navigate a computer, nor even have access to one".   Patient then reported, "I don't expect anyone to be able to help me, but I will be fine".  Patient denies feeling homicidal or suicidal at this time, verbalizing that she "could never take her own  life".  CSW offered to provide patient with a list of therapists in the community that specialize in depression, related specifically to onset of a new chronic medical condition, but patient declined all psychotherapeutic services offered.  Patient also denied wanting to converse with CSW about potentially starting an antidepressant medication.  Patient reported that she does not wish to take an antidepressant medication, "due to side effects" and "not wanting to take any more prescription medications than absolutely necessary".    Patient does not have a history of depression, reporting that her symptoms of depression all stem from her recent Stroke.  Patient indicated that her 45 year old son, Kip Kautzman lives with her, but that she never sees him because he works 12 hour shifts and is "in and out of the house a lot".  Patient regrets that Thayer Ohm is "not available more", admitting that "he is really not able to offer much".  Patient verbalized that she is now trying to do everything that she did two months ago, by herself, but she's simply unable to do it.  Patient stated that she would really like to receive in-home care assistance, not to be confused with home health services, just someone to come in and assist with performing light housekeeping duties, grocery shopping, meal preparation, laundry services and picking up prescription medications.  Patient denies being able to pay for these services out-of-pocket, hoping that she would qualify for some type of program.  CSW inquired as to whether or not patient thought she would be eligible for Adult Medicaid, through the [facility] Department of [facility].  Patient admitted to experiencing financial hardships, due to incoming medical expenses and current inability to work.  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  Patient is currently receiving Short-Term Disability from [place] [employer]  [place], which is her only source of income at present.  CSW offered to assist patient with completion of an Adult Medicaid application, then submitting on patient's behalf, but patient reported that she would need to think about it and that she did not think she would qualify due to income guidelines.  CSW even offered to mail patient an application.  Patient denies having any real family support and no one to rely on when she runs into financial troubles.  Patient indicated that she has transportation to and from all her physician appointments and that she is not currently experiencing food insecurities.  CSW offered to mail patient The [place] Book - [employer] in [place], as well as The Little [employer] Book - [employer], Stage manager and [employer] in [place].     Patient was in agreement with CSW sharing the results of her Depression Screening (PHQ-2 and PHQ-9) with her primary care physician; however, patient did not wish for her primary care physician to act upon these results or prescribe an antidepressant medication.  Patient appeared to be somewhat agitated and defensive today, not wanting to disclose a lot of information to CSW, or have CSW provide her with information or resources for counseling services.  Patient was also very resistant to receiving any kind of assistance for her symptoms of depression, refusing to reveal to CSW what types of things are causing her symptoms.  Patient admitted to being in her "usual state of health" until Saturday, November 23, 2018, at which point, she began  to experience left-sided weakness, a left-sided headache, nausea, lightheadedness, dizziness and occasional palpitations.  Patient indicated that she simply does not wish to have to go through this alone and "hates having to depend on anyone for anything".  CSW offered to mail patient EMMI information pertaining specifically to Signs and Symptoms of Depression; Depression, Depression: Other Things I  Can Do, but patient indicated that she would not read it, that it would probably just end up in the trash.  Patient also did not wish to receive a list of in-home care agencies, if she is responsible for paying these services out-of-pocket.  CSW explained to patient that if she is eligible for Adult Medicaid, then she could apply for CAP Musician(Community Alternative Program), with the [facility] Department of [facility], as well as Avaya[employer]S Orthoptist(Personal Care Services), through the [employer] with the [employer] [place] Department of [employer].  CSW agreed to place a referral for patient to The [place] Companymber Chrismon, Social Work AnimatorColleague, also with [facility] Management, to follow-up with patient about completing a Medicaid application, an application for CAP and an application for PCS.  CSW will also request that Mrs. Chrion provide patient with a list of in-home care agencies that accept Medicaid and provide patient with a list of community agencies and resources that offer financial assistance.   CSW explained to patient that she will be receiving an outreach call from Mrs. Chrismon within the next 10 business days.  Patient then took a call on the other line and never returned to her call with CSW.  CSW made several attempts to try and call patient back, but patient's phone just continued to ring, with no answer and no voicemail to pick up the call.  CSW will try and outreach to patient again next week, if a return call is not received from patient in the meantime.    Danford BadJoanna , BSW, MSW, LCSW  Licensed Restaurant manager, fast foodClinical Social Worker  [facility] [place] [facility]  Mailing YoeAddress-[address], [place], [place] [address] Physical Address-[address], [place], [place] [address] Toll Free Main # [phone] Fax # [phone] Cell # [phone]  Office # [phone] [email or web address]

## 2018-12-16 ENCOUNTER — Encounter: Payer: Self-pay | Admitting: *Deleted

## 2018-12-20 ENCOUNTER — Other Ambulatory Visit: Payer: Self-pay | Admitting: *Deleted

## 2018-12-20 ENCOUNTER — Encounter: Payer: Self-pay | Admitting: *Deleted

## 2018-12-20 ENCOUNTER — Telehealth: Payer: Self-pay | Admitting: Pharmacist

## 2018-12-20 ENCOUNTER — Other Ambulatory Visit: Payer: Self-pay

## 2018-12-20 NOTE — Patient Outreach (Signed)
[facility]) Care Management  12/20/2018  Tiffany Schmitt 04/25/1960 [id number]   CSW was able to make contact with patient today to follow-up regarding social work services and resources, as well as assess patient's willingness to receive counseling and supportive services for symptoms of depression.  Patient appeared to be in much better spirits today, but continues to deny the need for counseling services through [place] with [place] Management, or any other entity, for that matter.  CSW will mail patient a list of [place] in [facility], in the event that patient changes her mind.  CSW was also able to ensure that patient has the correct contact information for CSW, encouraging patient to contact CSW directly, if additional social work needs arise in the near future, or if patient changes her mind about receiving free telephonic counseling services through [place].  Patient stated, "There is a lot of stuff that I need to be doing that I can't physically do right now, which is very frustrating for me".  Patient gave the example of needing lawn care services, indicating that she has a little extra money set aside to pay someone to provide these services for her, but that she does not know of any lawn care agencies in and around [place], [place].  Patient went on to say that her cell phone is not equipped with Internet services, and that she does not have access to a computer; therefore, she is unable to Teacher, music care agencies.  CSW was able to perform a quick Internet search and provide patient with a list of 5 lawn care agencies, also agreeing to mail patient a complete list of agencies in [place] and specifically in [place], [place].  Patient is also concerned about not being able to get her housework done, reporting that her son, Tiffany Schmitt, currently residing with patient, does not assist her with these chores and  responsibilities.  Patient reported that [name] works 12 hour days, 5 days per week, but that when he is not at work, he is with his girlfriend.  Patient indicated that she has asked [name], on numerous occasions, to assist with housework, as well as provide financial assistance to help pay monthly expenses, but that [name] continues to default on both obligations.  Instead of continuing to ask [name] for his assistance, both physically and financially, CSW encouraged patient to make a list of chores that need to be performed, along with the date for which they must be completed, in addition to providing [place] with the amount he owes for half the rent, utilities, water, etc., and the date each bill is due.  Patient admitted that she would certainly give this suggestion a try, but that she was not very optimistic that it would change anything.  Patient was encouraged to remind [name] that she is currently on FMLA ([place]) through her employer [employer] and [place]), not even having received her first Short-Term Disability check yet, and having to rely on a credit card to make ends meet each month.  Patient indicated that she contacted the Retail buyer at [place] yesterday and that she was told she will probably not receive her first Short-Term Disability check for an additional 2-3 weeks.  CSW agreed to mail patient a list of [place], community agencies and resources that offer financial assistance for rent and utilities.  CSW also agreed to mail patient The [place]  Meals in [place], [employer] [place], Marketing executive and [employer] in [place] and a list of Teachers Insurance and Annuity Association.  Patient denied having food insecurities at present, but may run into this problem in the foreseeable future.  Patient indicated that she is interested in applying for [employer], as well as Adult Medicaid, both  through the [place].  CSW agreed to mail patient an application to apply for Food Stamps, in addition to an Adult Medicaid application, requesting that [employer], social work Social worker, also with [place] [facility], follow-up with patient to answer any questions that she may have regarding the applications, as well as assist with completion, if necessary.   Patient reported that she is about to run out of insulin and does not know if she will be able to afford to have the prescription refilled.  CSW talked with patient about the importance of medication compliance, and the ability for CSW to make a referral for patient to a certified pharmacy technician, again through [place] Management, to see if patient is eligible to receive financial assistance for any one of her ten prescription medications.  Patient was certainly agreeable to this plan, and most appreciative of CSW offering to her this available resource.  Patient admitted that she does not have any family living in [place], nor does she have friends, just a few neighbors that offered her assistance initially, but no longer come around.  Patient continues to deny interest in being connected with any type of support group.     Patient stated, "My doctors still can't give me an answer about how long it will take for me to heal".  Patient indicated that she has yet to be seen by a neurologist, her first appointment scheduled for Tuesday, December 24, 2018 with Buford Dresser, Nurse Practitioner with [facility].  Patient went on to say that she is receiving physical therapy, every other day, until Wednesday, January 29, 2019, with Linford Arnold, Physical Therapist with [facility], but that she does not foresee being back to baseline by mid December.  Patient is interested in applying for PCS ([place]), through  [employer], and CAPS Forensic scientist), through the [place], to see if she is eligible for in-home care services.  CSW agreed to mail patient an application for Personal Care for Ongoing Illnesses, Medical Condition or Disability in [place], [place] application, as well as an Land for [employer].  In addition, CSW will mail patient a list of [place], so that she can be thinking about which agency she would like to hire to receive services, in the event that she qualifies for Adult Medicaid and is able to receive these services free of charge.  Patient reported that she is still able to drive herself to and from her physician appointments, to the grocery store and to her pharmacy, but that it is extremely difficult to run errands alone.  Patient further reported that it takes her a lot longer to complete certain tasks, "which is very time-consuming and aggravating".    CSW will perform a case closure on patient, as all goals of treatment have been met from LCSW standpoint and no additional LCSW needs have been identified at this time.  CSW will place a referral for patient to [employer], BSW,  as well as to a Industrial/product designer, both with [facility] Management, per patient's verbal consent.  CSW will fax an update to patient's Primary Care Physician, Dr. Joyce Gross to ensure that he is aware of CSW's involvement with patient's plan of care.  Last, the following list of information will be mailed to patient's home today:    LIST OF [place], [place] CARE FOR ONGOING ILLNESSES, MEDICAL CONDITION OR DISABILITY IN [place], [place] RESOURCES IN [place] - FOOD BANKS, FOOD PANTRIES & SOUP KITCHENS IN [place] THE LITTLE GREEN BOOK - FREE MEALS IN [place] IN-HOME CARE AND RESPITE AGENCIES CAPS (COMMUNITY ALTERNATIVE PROGRAM SERVICES) APPLICATION  Nat Christen, BSW, MSW, [place]  Licensed Clinical Social Worker  [place]  Mailing [place] [address], [place], [place] [address] Physical Address-[address], [place], [place] [address] Toll Free Main # [phone] Fax # [phone] Cell # [phone]  Office # [phone] [email or web address]

## 2018-12-20 NOTE — Patient Outreach (Signed)
[facility]) Care Management  12/20/2018  Tiffany Schmitt 1960-05-04 [id number]   Patient was called regarding medication assistance. Unfortunately, she did not answer her phone. HIPAA compliant message was left on her voicemail.  Plan: Send patient an unsuccessful outreach letter. Call patient back in 5-7 business days.  [name], PharmD, [place] Clinical Pharmacist [phone]

## 2018-12-20 NOTE — Patient Outreach (Signed)
[facility]) Care Management  12/20/2018  Tiffany Schmitt 02/26/1960 [id number]   In-basket message from [place], Nat Christen, to contact patient regarding resources mailed to her today: LIST OF LAWN CARE SERVICES IN [place], [place] ADULT MEDICAID APPLICATION FOOD STAMPS APPLICATION PERSONAL CARE FOR ONGOING ILLNESSES, MEDICAL CONDITION OR DISABILITY IN [place], [place] RESOURCES IN [place] - FOOD BANKS, FOOD PANTRIES & SOUP KITCHENS IN [place] THE LITTLE GREEN BOOK - FREE MEALS IN [place] IN-HOME CARE AND RESPITE AGENCIES CAP (COMMUNITY ALTERNATIVE PROGRAM) APPLICATION  Will contact patient within the next two weeks to ensure receipt of resources and assist further if needed.  Ronn Melena, BSW Social Worker [phone]

## 2018-12-24 ENCOUNTER — Ambulatory Visit: Payer: Self-pay | Admitting: Pharmacist

## 2018-12-24 ENCOUNTER — Other Ambulatory Visit: Payer: Self-pay | Admitting: Pharmacist

## 2018-12-24 NOTE — Addendum Note (Signed)
Addended by: [name] on: 12/24/2018 06:23 PM   Modules accepted: Orders

## 2018-12-24 NOTE — Patient Outreach (Addendum)
[facility]) Care Management  12/25/2018  Tiffany Schmitt 03/31/1960 [id number]   Patient was called regarding medication assistance. Unfortunately, she did not answer her phone. HIPAA compliant message was left on her voicemail. Unsuccessful outreach letter was sent on 12/20/2018  Plan: Call patient back in 7-10 business days.  [name], PharmD, [place] Clinical Pharmacist [phone]   ADDENDUM  Patient called back.  HIPAA identifiers were obtained.  Patient is a 58 year old female with multiple medical conditions including but not limited to:  Type 2 diabetes, hypertension, and history of stroke.  Patient expressed concern paying for Trulicity, Humalog, and Lantus.  Patient's medications were reviewed via telephone: Medications Reviewed Today    Reviewed by [name], [facility] (Pharmacist) on 12/24/18 at [place] List Status: <None>  Medication Order Taking? Sig Documenting Provider Last Dose Status Informant  atorvastatin (LIPITOR) 40 MG tablet [id number] No Take 1 tablet (40 mg total) by mouth daily at 6 PM.  Patient not taking: Reported on 12/24/2018   Darliss Cheney, MD Not Taking Active   Insulin Glargine (LANTUS) 100 UNIT/ML Solostar Pen [id number] Yes Inject 15 Units into the skin at bedtime. Darliss Cheney, MD Taking Active   insulin lispro (HUMALOG) 100 UNIT/ML injection [id number] Yes Inject 0.05-0.1 mLs (5-10 Units total) into the skin 2 (two) times daily. Sliding Scale Darliss Cheney, MD Taking Active   Insulin Pen Needle 32G X 4 MM MISC [id number] Yes n Darliss Cheney, MD Taking Active   lisinopril-hydrochlorothiazide (ZESTORETIC) 10-12.5 MG tablet [id number] Yes Take 1 tablet by mouth daily.  [provider] Taking Active Self  metFORMIN (GLUCOPHAGE) 1000 MG tablet [id number] Yes Take 1,000 mg by mouth 2 (two) times daily. [provider] Taking Active Self        Discontinued 12/24/18 1659 (Completed Course)   TRULICITY 2.99  BZ/1.6RC SOPN [id number] Yes Inject 0.75 mg into the skin every Saturday.  [provider] Taking Active Self         Medication Review:  Drugs sorted by system:  Cardiovascular: Lisinopril-HCTZ, Pravastatin  Endocrine: Lantus, Humalog, Metformin, Trulicity  Cyclopentolate ophthalmic solution  Patient has Pharmacist, community. As such, she is not eligible for Medicare LIS/Extra Help or Patient Assistance Programs.  Manufacturer coupons were sent to the patient's pharmacy for Trulicity, Lantus, and Humalog. The pharmacy was called to assist with billing and the technician said the Trulicity did not have refills, and the Lantus was too early.  The Humalog coupon was not legible but it was resent .  Plan: Follow up with the patient on billing in 2-3 weeks since it was not time for her diabetes medications to be filled.   [name], PharmD, [place] Clinical Pharmacist [phone]

## 2018-12-25 NOTE — Addendum Note (Signed)
Addended by: [name] on: 12/25/2018 08:09 PM   Modules accepted: Orders

## 2018-12-26 ENCOUNTER — Inpatient Hospital Stay: Payer: Self-pay | Admitting: Adult Health

## 2018-12-31 ENCOUNTER — Other Ambulatory Visit: Payer: Self-pay | Admitting: *Deleted

## 2018-12-31 NOTE — Patient Outreach (Signed)
Tiffany Schmitt) Care Management  12/31/2018  Tiffany Schmitt 10/30/1960 [id number]   Received voicemail message from [facility] on 12/19/2018 states she has received letter and requested call back regarding services.  Per chart review, patient received call back from [facility] Social Worker on 12/20/2018 and Pharmacist on 12/20/2018, no nursing needs identified on 12/12/2018 patient outreach.  Telephone call to patient's home / mobile number, times 2, first call was a bad phone connection, patient requested call back, spoke with patient, and HIPAA verified.  States she remembers speaking with this RNCM in the past.  States she received lots of information from [place] [facility], the information is overwhelming, unable to get to the places to follow up on identified resources, and is unable to follow up due to her current state of health. States she does not have what she needs regarding food, did not state any nursing needs at this time, advised her to speak with [place] [facility] Social Worker and Pharmacist regarding paperwork she has been sent to see if there is other assistance that she can access from home due to her current situation, advised patient information is typically a requirement of the identified resource,  is patient voices understanding, appreciative of follow up call, states thank you, and patient disconnects phone call.  Telephone call to Denyse Amass Pharmacist at [facility], advised of conversations with patient above and on 12/12/2018, patient today's concerns, discussed case, next steps,  Alwyn Ren will reach out to patient next week regarding status of pharmacy resources. Telephone call to [place] Worker at [facility], no answer, left message, and requested call back.  Sent the below in-basket message to [employer], regarding conversations with patient, and requested follow up.  Hello Museum/gallery conservator, I spoke with patient today  and she had some concerns regarding the resource paperwork that she received, please see my RNCM notes for additional information, and call me if you have any questions. Thanks, Lequita Meadowcroft H.  Amariah Kierstead RN, BSN, CCM   Case will remain closed to [facility] and no further RNCM needs at this time.     Keo Schirmer H. Annia Friendly, BSN, [place] Management [facility] Telephonic CM Phone: [phone] Fax: [phone]

## 2018-12-31 NOTE — Patient Outreach (Signed)
[facility]) Care Management  12/31/2018  Nylene Inlow [birth year] [id number]   Received voicemail message from [employer] Social Worker at [facility], states she received message, in-basket message, has reviewed RNCM notes, and requested call back if additional questions.  Telephone call to [place] Worker at [facility], no answer, and no message left.  Telephone call from [place] at [facility] Management, RNCM advised of conversation with patient,  Discussed patient's concerns, states she will follow up with patient on 12/31/2018 or 01/01/2019, prior to next scheduled patient outreach on 01/03/2019.  States she is  aware patient was sent a lot of resource information for her identified needs, will review each mailed community resource with patient, answer patient's question, and assist with document completion as needed.  No further RNCM needs at this time and case will remain closed to [facility].   Fed Ceci H. Annia Friendly, BSN, [place] Management [facility] Telephonic CM Phone: [phone] Fax: [phone]

## 2019-01-01 ENCOUNTER — Other Ambulatory Visit: Payer: Self-pay

## 2019-01-01 NOTE — Patient Outreach (Signed)
[facility]) Care Management  01/01/2019  Tiffany Schmitt Dec 27, 1960 [id number]   Received message from [facility], Sonda Rumble, to outreach patient regarding resources that were mailed to her by CSW, [employer]  Per Carlyon Shadow, patient expressed being overwhelmed by amount of information received and is requesting assistance. Successful outreach to patient today.  Patient did express that information received was overwhelming.  Validated this feeling and acknowledged that it was a lot of information to receive at once.  Offered to review each resource one by one with patient.  She stated that she did not have time as she was getting ready for an appointment.  Patient stated that food resources provided are not helpful because they are for [facility] and she resides in [place].  Informed her that EHR indicates that she resides in [facility]. Offered to send resources for [facility] but she declined.  Patient stated that she attempted to contact Department of Social Services regarding Medicaid but did not receive return call.  Acknowledged that it can be difficult to contact caseworkers with DSS.  Did encourage patient to complete Medicaid application that was mailed to her as this will be needed to determine eligibility. At this time during the call, patient stated that she needed to finish getting ready for her appointment and said" you can call me back later if you need to."  Told patient that I am happy to further review documentation that was sent to her if she would like to schedule a time to do so.  Patient stated "no, I will be Ok.'  Ensured that she has my contact information and encouraged her to call if she changes her mind about further assistance. Closing social work case at this time.  Ronn Melena, BSW Social Worker [phone]

## 2019-01-03 ENCOUNTER — Ambulatory Visit: Payer: Self-pay

## 2019-01-06 ENCOUNTER — Other Ambulatory Visit: Payer: Self-pay | Admitting: Pharmacist

## 2019-01-07 NOTE — Patient Outreach (Signed)
[facility]) Care Management  01/07/2019  Tiffany Schmitt 06-21-1960 [id number]  Patient was called to follow up on the coupons that were sent to her pharmacy on her behalf. HIPAA identifiers were obtained.   Patient said she was not aware of the coupons.  Patient's pharmacy was called. The Pharmacist reported Humalog was $0 and Trulicity was $20. The coupon sent for Trulicity on 11/19/1217 was for a $25 copay and the one for Humalog was $0.  Lantus was filled 11/29/2018 for a 90 day supply so it was too early to be filled.   Patient will be mailed the coupon for Lantus and she can use it at her pharmacy when it is time for her refill in January.  Plan: Mail patient the coupon.(Via routed note to [employer]) Follow up in January to see if she was able to get the prescription filled with the coupon.  [name], PharmD, [place] Clinical Pharmacist [phone]

## 2019-01-08 ENCOUNTER — Ambulatory Visit: Payer: Self-pay | Admitting: Pharmacist

## 2019-01-21 ENCOUNTER — Inpatient Hospital Stay: Payer: Self-pay | Admitting: Adult Health

## 2019-02-26 ENCOUNTER — Encounter: Payer: Self-pay | Admitting: Pharmacist

## 2019-02-27 ENCOUNTER — Other Ambulatory Visit: Payer: Self-pay | Admitting: Pharmacist

## 2019-02-27 NOTE — Patient Outreach (Signed)
[facility]) Care Management  02/27/2019  Tiffany Schmitt 02-06-1961 [id number]   Patient was called for medication assistance follow up. HIPAA identifiers were obtained.   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    She reported needing refills on Lantus and Trulicity.  The patient's pharmacy was called, Lantus was filled with a $90 copay and Trulicity was filled with a $25 copay.    Patient reported having issues getting to the pharmacy to pick up her medication and that they would not bring it out to her car.   [facility] reported they have a curb side service and since the beginning the year, they will also mail the patient's prescriptions through the local store.  Patient said she will have to consider using [facility] for getting her medications in the mail because you have to provide them will your billing information.  A coupon to help offset the copay of Lantus will be faxed to the patient's pharmacy.  Coupon was faxed to [employer]. Spoke to Pharmacologist who said they did not receive the coupon.  Had coupon resent this morning.  Called the [facility] back and spoke to a Pharmacologist. He reported Lantus had been filled and billed to the patient's insurance in tandem with the coupon. The copay was $0.00.  Patient was called back to inform her of the status of her medications and to speak with her about curb side service and mail order service provided by [facility].  Unfortunately, she did not answer the phone. HIPAA compliant message was left on her voicemail.  Plan: Call patient back in 3-5 business days to see if she was able to pick up her medications.  Beecher Mcardle, PharmD, BCACP [facility] Clinical Pharmacist [phone]

## 2019-03-06 ENCOUNTER — Other Ambulatory Visit: Payer: Self-pay | Admitting: Pharmacist

## 2019-03-06 NOTE — Patient Outreach (Signed)
[facility]) Care Management  03/06/2019  Tiffany Schmitt 09/21/1960 [id number]   Called patient to make sure she was able to pick up her medications from her pharmacy and that they were billed correctly. HIPAA identifiers were obtained.     Patient confirmed she was able to pick up her Lantus with a $0 copay and her Trulicity with a $22 copay.  Patient has Pharmacist, community. Her pharmacy was sent drug manufacturer coupons to bill in tandem with her insurance.   Plan: Close patient's case as her pharmacy needs have been met.  [name], PharmD, [place] Clinical Pharmacist [phone]

## 2021-07-26 IMAGING — CT CT ANGIO HEAD
1 of 8 series · 7 of 33 positions shown · IV contrast (omnipaque)
Comparison: Noncontrast head CT performed earlier the same day
11/27/2018.

CLINICAL DATA: Focal neuro deficit, greater than 6 hours, stroke
suspected. Additional history provided: Patient presents with
left-sided weakness that began [REDACTED] unknown time. Reports
headache on [REDACTED] and not feeling well throughout the weekend,
feeling off balance and heaviness of left side.

EXAM:
CT ANGIOGRAPHY HEAD AND NECK
TECHNIQUE: Multidetector CT imaging of the head and neck was performed using
the standard protocol during bolus administration of intravenous
contrast. Multiplanar CT image reconstructions and MIPs were
obtained to evaluate the vascular anatomy. Carotid stenosis
measurements (when applicable) are obtained utilizing NASCET
criteria, using the distal internal carotid diameter as the
denominator.
CONTRAST:  75mL OMNIPAQUE IOHEXOL 350 MG/ML SOLN

[Series 11: axial thin · axial · 0.52mm/px · z∈[+869,+1124]mm · 7 of 341 slices shown]
[im 43/341  soft-tissue]
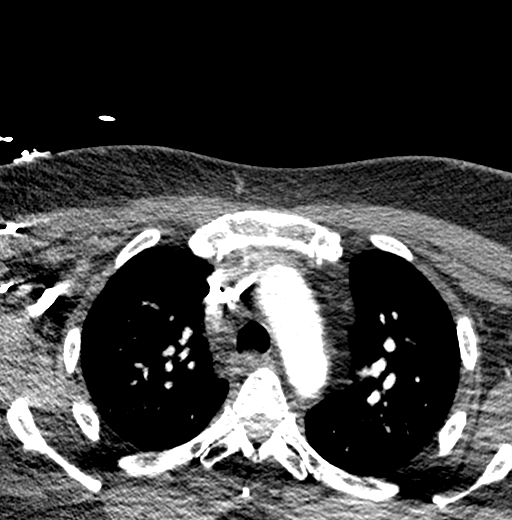
[im 86/341  bone]
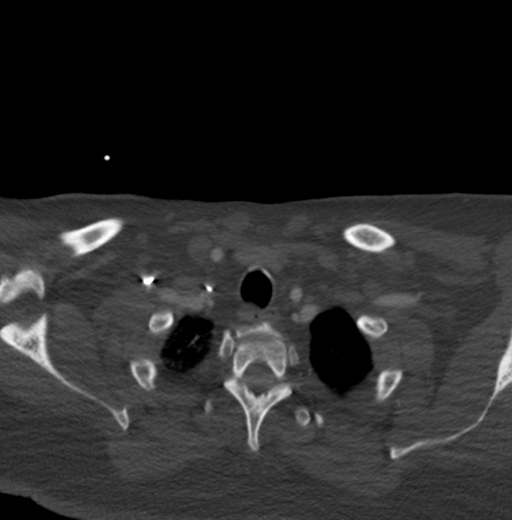
[im 128/341  soft-tissue]
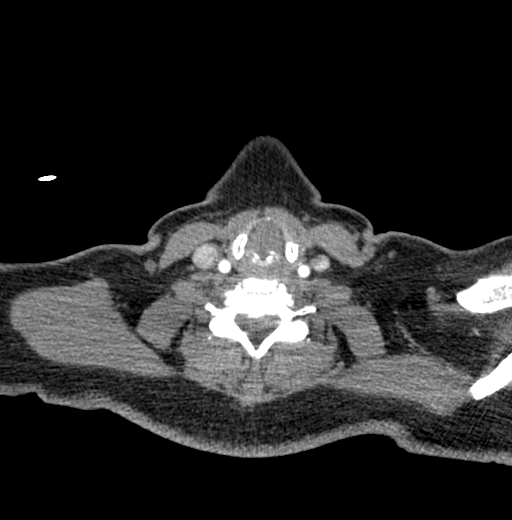
[im 171/341  bone]
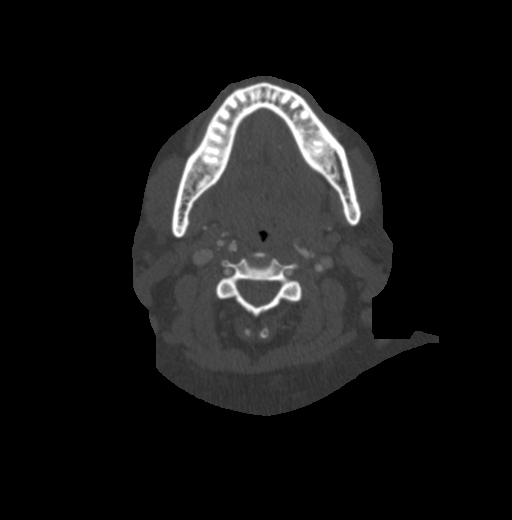
[im 213/341  soft-tissue]
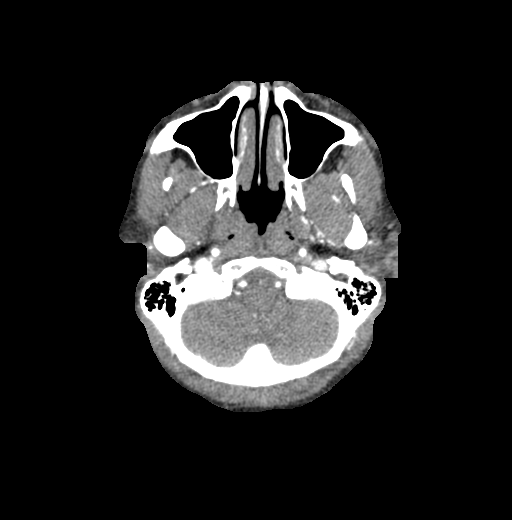
[im 256/341  bone]
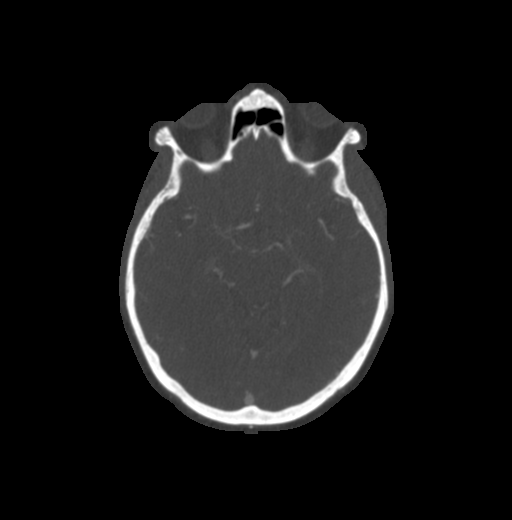
[im 298/341  soft-tissue]
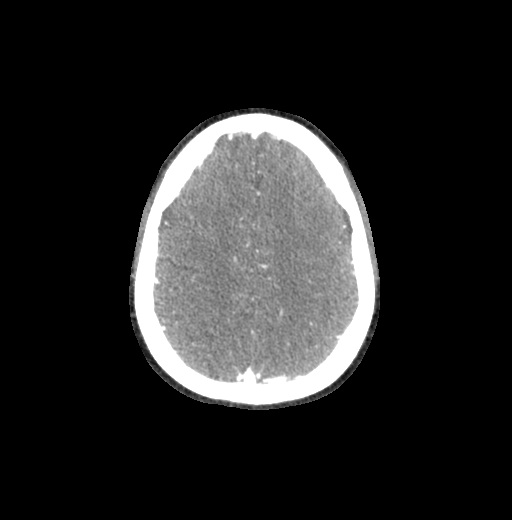

[7 of 33 positions shown; findings below may reference images not displayed]

FINDINGS: CT HEAD FINDINGS

No evidence of acute intracranial hemorrhage.

No demarcated cortical infarction.

No evidence of intracranial mass.

No midline shift or extra-axial fluid collection.

Cerebral volume is normal for age.

Vascular: Reported separately

Skull: Normal. Negative for fracture or focal lesion.

Sinuses: No significant paranasal sinus disease or mastoid effusion.

Orbits: Visualized orbits demonstrate no acute abnormality.

Review of the MIP images confirms the above findings

CTA NECK FINDINGS

Aortic arch: Common origin of the innominate and left common carotid
arteries. Included portions of the aortic arch demonstrate no
evidence of dissection or aneurysm. Atherosclerotic plaque within
the visualized aortic arch and proximal major branch vessels of the
neck.

Right carotid system: Common and internal carotid arteries patent
within the neck without stenosis. Minimal soft and calcified plaque
within the proximal ICA.

Left carotid system: Common carotid artery patent without stenosis.
There is significant plaque within the carotid bifurcation and
proximal ICA. It is difficult to accurately measure the degree of
stenosis at the origin of the left ICA due to irregularity of soft
and calcified plaque at this site. There may be greater than 50%
stenosis at the origin of the left ICA. Distal to this the ICA is
patent within the neck without significant stenosis.

Vertebral arteries: Right vertebral artery dominant. The vertebral
arteries are patent within the neck bilaterally without significant
stenosis.

Skeleton: No acute bony abnormality. Cervical spondylosis greatest
at C5-C6 and C6-C7 where there is moderate disc degeneration small
posterior disc osteophytes.

Other neck: No soft tissue neck mass or pathologically enlarged
cervical chain lymph nodes. Thyroid negative.

Upper chest: Patchy ground-glass opacity/mosaic attenuation within
the imaged lung apices may reflect mild edema or sequela of small
airways disease.

Review of the MIP images confirms the above findings

CTA HEAD FINDINGS

Anterior circulation:

Soft and calcified plaque within the cavernous and paraclinoid right
internal carotid artery. Sites of mild to moderate stenosis within
the cavernous right ICA. Moderate focal stenosis within the
paraclinoid right ICA (series 11, image 247).

Soft and calcified plaque within the cavernous and paraclinoid left
internal carotid artery. Sites of at least moderate stenosis within
the cavernous left ICA.

The right middle and anterior cerebral arteries are patent without
significant proximal stenosis.

The left middle and anterior cerebral arteries are patent without
significant proximal stenosis.

No intracranial aneurysm is identified.

Posterior circulation:

Significant soft and calcified plaque within the intracranial right
vertebral artery with sites of up to moderate stenosis.

The left vertebral artery is patent without significant stenosis.

Atherosclerotic irregularity of the basilar artery with noncalcified
plaque. Focal moderate stenosis within the mid to distal basilar
artery.

Significant atherosclerotic irregularity of the bilateral posterior
cerebral arteries. Mild-to-moderate focal stenosis within the right
PCA at the P1-P2 junction. Moderate/severe focal stenosis within the
P2 left posterior cerebral artery.

Venous sinuses: Within limitations of contrast timing, no convincing
thrombus.

Anatomic variants: Posterior cerebral arteries are poorly delineated
bilaterally and may be hypoplastic or absent.

Review of the MIP images confirms the above findings
IMPRESSION: CT head:

No CT evidence of acute intracranial abnormality. Consider brain MRI
for further evaluation, as clinically warranted.

CTA head:

1. Intracranial atherosclerotic disease with multifocal stenoses,
most notably as follows.
2. Moderate stenosis within the paraclinoid right internal carotid
artery.
3. Sites of at least moderate stenosis within the cavernous left
internal carotid artery.
4. Sites of up to moderate stenosis within the intracranial right
vertebral artery.
5. Focal moderate stenosis within the mid to distal basilar artery.
6. Focal moderate/severe stenosis within the P2 left posterior
cerebral artery.

CTA neck:

1. Common and internal carotid arteries patent within the neck
bilaterally. It is difficult to accurately quantify the degree of
stenosis at the origin of the left ICA due to irregularity of
prominent soft and calcified plaque at this site. Greater than 50%
stenosis may be present. Carotid artery duplex recommended for
further evaluation.
2. Bilateral vertebral arteries patent within the neck without
stenosis.
3. Patchy ground-glass opacity/mosaic attenuation within the imaged
lung apices, which may reflect edema or sequela of small airways
disease.

## 2022-06-10 ENCOUNTER — Emergency Department (HOSPITAL_BASED_OUTPATIENT_CLINIC_OR_DEPARTMENT_OTHER)
Admission: EM | Admit: 2022-06-10 | Discharge: 2022-06-10 | Disposition: A | Discharge status: Home / Self Care | Payer: Commercial Managed Care - PPO | Attending: Emergency Medicine | Admitting: Emergency Medicine

## 2022-06-10 ENCOUNTER — Encounter (HOSPITAL_BASED_OUTPATIENT_CLINIC_OR_DEPARTMENT_OTHER): Payer: Self-pay | Admitting: Emergency Medicine

## 2022-06-10 ENCOUNTER — Other Ambulatory Visit: Payer: Self-pay

## 2022-06-10 DIAGNOSIS — M79604 Pain in right leg: Secondary | ICD-10-CM | POA: Diagnosis not present

## 2022-06-10 DIAGNOSIS — E119 Type 2 diabetes mellitus without complications: Secondary | ICD-10-CM | POA: Insufficient documentation

## 2022-06-10 DIAGNOSIS — R252 Cramp and spasm: Secondary | ICD-10-CM | POA: Insufficient documentation

## 2022-06-10 DIAGNOSIS — M79605 Pain in left leg: Secondary | ICD-10-CM | POA: Diagnosis not present

## 2022-06-10 DIAGNOSIS — I10 Essential (primary) hypertension: Secondary | ICD-10-CM | POA: Insufficient documentation

## 2022-06-10 DIAGNOSIS — Z7984 Long term (current) use of oral hypoglycemic drugs: Secondary | ICD-10-CM | POA: Diagnosis not present

## 2022-06-10 DIAGNOSIS — Z794 Long term (current) use of insulin: Secondary | ICD-10-CM | POA: Diagnosis not present

## 2022-06-10 HISTORY — DX: Sciatica, unspecified side: M54.30

## 2022-06-10 HISTORY — DX: Polyneuropathy, unspecified: G62.9

## 2022-06-10 LAB — CBC WITH DIFFERENTIAL/PLATELET
Abs Immature Granulocytes: 0.03 10*3/uL (ref 0.00–0.07)
Basophils Absolute: 0.1 10*3/uL (ref 0.0–0.1)
Basophils Relative: 1 %
Eosinophils Absolute: 0.4 10*3/uL (ref 0.0–0.5)
Eosinophils Relative: 5 %
HCT: 38.5 % (ref 36.0–46.0)
Hemoglobin: 12.7 g/dL (ref 12.0–15.0)
Immature Granulocytes: 0 %
Lymphocytes Relative: 27 %
Lymphs Abs: 2 10*3/uL (ref 0.7–4.0)
MCH: 27.4 pg (ref 26.0–34.0)
MCHC: 33 g/dL (ref 30.0–36.0)
MCV: 83 fL (ref 80.0–100.0)
Monocytes Absolute: 0.6 10*3/uL (ref 0.1–1.0)
Monocytes Relative: 9 %
Neutro Abs: 4.3 10*3/uL (ref 1.7–7.7)
Neutrophils Relative %: 58 %
Platelets: 246 10*3/uL (ref 150–400)
RBC: 4.64 MIL/uL (ref 3.87–5.11)
RDW: 13.8 % (ref 11.5–15.5)
WBC: 7.4 10*3/uL (ref 4.0–10.5)
nRBC: 0 % (ref 0.0–0.2)

## 2022-06-10 LAB — MAGNESIUM: Magnesium: 1.5 mg/dL — ABNORMAL LOW (ref 1.7–2.4)

## 2022-06-10 LAB — BASIC METABOLIC PANEL
Anion gap: 10 (ref 5–15)
BUN: 33 mg/dL — ABNORMAL HIGH (ref 8–23)
CO2: 24 mmol/L (ref 22–32)
Calcium: 9.2 mg/dL (ref 8.9–10.3)
Chloride: 100 mmol/L (ref 98–111)
Creatinine, Ser: 1.02 mg/dL — ABNORMAL HIGH (ref 0.44–1.00)
GFR, Estimated: >60 mL/min (ref >60)
Glucose, Bld: 262 mg/dL — ABNORMAL HIGH (ref 70–99)
Potassium: 4 mmol/L (ref 3.5–5.1)
Sodium: 134 mmol/L — ABNORMAL LOW (ref 135–145)

## 2022-06-10 LAB — CBG MONITORING, ED: Glucose-Capillary: 287 mg/dL — ABNORMAL HIGH (ref 70–99)

## 2022-06-10 MED ORDER — MAGNESIUM OXIDE -MG SUPPLEMENT 400 (240 MG) MG PO TABS
800.0000 mg | ORAL_TABLET | Freq: Once | ORAL | Status: AC
  Administered 2022-06-10: 800 mg via ORAL
  Filled 2022-06-10: qty 2

## 2022-06-10 MED ORDER — INSULIN LISPRO 100 UNIT/ML ~~LOC~~ SOLN: 5.0000 [IU] | Freq: Two times a day (BID) | SUBCUTANEOUS | 11 refills | Status: AC

## 2022-06-10 MED ORDER — HYDROXYZINE HCL 25 MG PO TABS: 25.0000 mg | ORAL_TABLET | Freq: Every evening | ORAL | 0 refills | Status: AC

## 2022-06-10 MED ORDER — INSULIN GLARGINE 100 UNIT/ML SOLOSTAR PEN: 15.0000 [IU] | PEN_INJECTOR | Freq: Every day | SUBCUTANEOUS | 0 refills | Status: AC

## 2022-06-10 MED ORDER — TRULICITY 0.75 MG/0.5ML ~~LOC~~ SOAJ: 0.7500 mg | SUBCUTANEOUS | 0 refills | Status: AC

## 2022-06-10 MED ORDER — INSULIN PEN NEEDLE 32G X 4 MM MISC: 0 refills | Status: AC

## 2022-06-10 MED ORDER — METFORMIN HCL 1000 MG PO TABS: 1000.0000 mg | ORAL_TABLET | Freq: Two times a day (BID) | ORAL | 0 refills | Status: AC

## 2022-06-10 NOTE — ED Triage Notes (Addendum)
Pt c/o chronic BLE pain and cramping, was worse last night; hx of neuropathy; sts she cannot get several medications (DM and neuropathy) d/t insurance does not cover it

## 2022-06-10 NOTE — Discharge Instructions (Signed)
You came to the emergency department today with pain in your legs.  He also had some burning sensation.  We believe this is secondary to your neuropathy.  Your magnesium was slightly low so this was repleted today.  This may help your cramping as well.  Ultimately it is very important for you to keep your appointment with endocrinology as well as a PCP to get better control of your diabetes.  They may also talk to you about other options for neuropathy.  I have refilled all of your diabetic medications.  You do not have to pick them all up however I want to do have access to all of them.  I also prescribed you hydroxyzine for better sleep.  Do not hesitate to return to the emergency department with any worsening symptoms.

## 2022-06-10 NOTE — ED Provider Notes (Signed)
[place] [facility] AT [facility] Provider Note   CSN: [id number] Arrival date & time: 06/10/22  1118     History  Chief Complaint  Patient presents with   Leg Pain    Tiffany Schmitt is a 62 y.o. female with a past medical history of hypertension and type 2 diabetes presenting today with leg discomfort.  She reports that she has been having burning and cramping in the lower extremities for a while that is becoming intolerable and she cannot sleep.  She reports she has a history of neuropathy but is unable to get her medications for her type 2 diabetes due to insurance.  No recent long travel, surgery, history of DVT, does not smoke and not on any estrogen products.   Leg Pain Associated symptoms: no fever        Home Medications Prior to Admission medications   Medication Sig Start Date End Date Taking? Authorizing Provider  clopidogrel (PLAVIX) 75 MG tablet Take 75 mg by mouth daily. 12/24/18   [provider]  cyclopentolate (CYCLODRYL,CYCLOGYL) 1 % ophthalmic solution  12/24/18   [provider]  DULoxetine (CYMBALTA) 60 MG capsule Take 1 capsule by mouth daily. 02/26/19   [provider]  Insulin Glargine (LANTUS) 100 UNIT/ML Solostar Pen Inject 15 Units into the skin at bedtime. 11/29/18   Hughie Closs, MD  insulin lispro (HUMALOG) 100 UNIT/ML injection Inject 0.05-0.1 mLs (5-10 Units total) into the skin 2 (two) times daily. Sliding Scale 11/29/18   Hughie Closs, MD  Insulin Pen Needle 32G X 4 MM MISC n 11/29/18   Hughie Closs, MD  losartan-hydrochlorothiazide (HYZAAR) 100-25 MG tablet Take 1 tablet by mouth daily. 02/26/19   [provider]  metFORMIN (GLUCOPHAGE) 1000 MG tablet Take 1,000 mg by mouth 2 (two) times daily. 10/28/18   [provider]  omeprazole (PRILOSEC) 20 MG capsule Take 1 capsule by mouth daily.    [provider]  pravastatin (PRAVACHOL) 40 MG tablet Take 40 mg by mouth  daily. 12/24/18   [provider]  TRULICITY 0.75 MG/0.5ML SOPN Inject 0.75 mg into the skin every Saturday.  11/09/18   [provider]      Allergies    Cephalexin, Gabapentin, Atorvastatin, Gemfibrozil, Pioglitazone, and Rosuvastatin    Review of Systems   Review of Systems  Constitutional:  Negative for chills and fever.  Respiratory:  Negative for chest tightness and shortness of breath.   Cardiovascular:  Negative for chest pain and palpitations.    Physical Exam Updated Vital Signs BP (!) 185/79 (BP Location: Right Arm)   Pulse 65   Temp 98.6 F (37 C) (Oral)   Resp 19   Ht 5\' 3"  (1.6 m)   Wt 99.8 kg   SpO2 100%   BMI 38.97 kg/m  Physical Exam Vitals and nursing note reviewed.  Constitutional:      Appearance: Normal appearance.  HENT:     Head: Normocephalic and atraumatic.  Eyes:     General: No scleral icterus.    Conjunctiva/sclera: Conjunctivae normal.  Pulmonary:     Effort: Pulmonary effort is normal. No respiratory distress.  Musculoskeletal:     Comments: Full range of motion of bilateral lower extremities, 2+ DP pulses bilaterally  Skin:    Findings: No rash.  Neurological:     Mental Status: She is alert.  Psychiatric:        Mood and Affect: Mood normal.     ED Results /  Procedures / Treatments   Labs (all labs ordered are listed, but only abnormal results are displayed) Labs Reviewed  BASIC METABOLIC PANEL - Abnormal; Notable for the following components:      Result Value   Sodium 134 (*)    Glucose, Bld 262 (*)    BUN 33 (*)    Creatinine, Ser 1.02 (*)    All other components within normal limits  MAGNESIUM - Abnormal; Notable for the following components:   Magnesium 1.5 (*)    All other components within normal limits  CBG MONITORING, ED - Abnormal; Notable for the following components:   Glucose-Capillary 287 (*)    All other components within normal limits  CBC WITH DIFFERENTIAL/PLATELET     EKG None  Radiology No results found.  Procedures Procedures   Medications Ordered in ED Medications  magnesium oxide (MAG-OX) tablet 800 mg (800 mg Oral Given 06/10/22 1528)    ED Course/ Medical Decision Making/ A&P                             Medical Decision Making Amount and/or Complexity of Data Reviewed Labs: ordered.  Risk OTC drugs. Prescription drug management.   Leg discomfort.  Differential includes but is not limited to DVT, cellulitis, neuropathy, PAD.  This is not exhaustive  Lab work: Slightly low magnesium to 1.5, hyperglycemia to 260.  Creatinine 1.02, nearly normal.  Physical exam: Neurovascularly intact.  No swelling or overlying cellulitis  MDM/disposition: 61 year old female presenting today with bilateral leg pain.  History of neuropathy.  She has no risk factors for DVT/PE.  Neurovascularly intact without cellulitis on physical exam.  No appreciable edema.  Do not believe this is a new onset heart failure.  Also do not believe she has a DVT, low likelihood [place].  At this time I believe her symptoms are secondary to neuropathy.  She has a listed itching allergy to gabapentin.  We discussed that this is likely not an anaphylactic reaction however she does not want to try this medication.  Also says that Lyrica did not work.  Per chart review her last A1c was 9.  She needs to work closer outpatient to control her diabetes and find a plan for her neuropathy.  Will use Tylenol and ibuprofen for any discomfort.  Diabetic medications refilled and she was referred to multiple PCPs Final Clinical Impression(s) / ED Diagnoses Final diagnoses:  Bilateral leg pain    Rx / DC Orders ED Discharge Orders          Ordered    insulin glargine (LANTUS) 100 UNIT/ML Solostar Pen  Daily at bedtime        06/10/22 1421    TRULICITY 0.75 MG/0.5ML SOPN  Every Sat        06/10/22 1421    Insulin Pen Needle 32G X 4 MM MISC        06/10/22 1421    insulin lispro  (HUMALOG) 100 UNIT/ML injection  2 times daily        06/10/22 1421    metFORMIN (GLUCOPHAGE) 1000 MG tablet  2 times daily        06/10/22 1421    hydrOXYzine (ATARAX) 25 MG tablet  Nightly        06/10/22 1421           Results and diagnoses were explained to the patient. Return precautions discussed in full. Patient had no additional questions and expressed  complete understanding.   This chart was dictated using voice recognition software.  Despite best efforts to proofread,  errors can occur which can change the documentation meaning.     Woodroe Chen 06/10/22 1552    Lonell Grandchild, MD 06/11/22 1214
